# Patient Record
Sex: Female | Born: 1945 | Race: Black or African American | Hispanic: No | Smoking: Former smoker
Health system: Southern US, Community
[De-identification: ages and names within clinical notes are randomized; demographics above are authoritative.]

## PROBLEM LIST (undated history)

## (undated) DIAGNOSIS — E785 Hyperlipidemia, unspecified: Secondary | ICD-10-CM

## (undated) DIAGNOSIS — Z889 Allergy status to unspecified drugs, medicaments and biological substances status: Secondary | ICD-10-CM

## (undated) DIAGNOSIS — M1990 Unspecified osteoarthritis, unspecified site: Secondary | ICD-10-CM

## (undated) DIAGNOSIS — I10 Essential (primary) hypertension: Secondary | ICD-10-CM

## (undated) HISTORY — DX: Hyperlipidemia, unspecified: E78.5

## (undated) HISTORY — PX: TUBAL LIGATION: SHX77

## (undated) HISTORY — PX: BREAST SURGERY: SHX581

## (undated) HISTORY — DX: Essential (primary) hypertension: I10

## (undated) HISTORY — DX: Unspecified osteoarthritis, unspecified site: M19.90

---

## 1998-12-13 ENCOUNTER — Emergency Department (HOSPITAL_COMMUNITY): Admission: EM | Admit: 1998-12-13 | Discharge: 1998-12-13 | Payer: Self-pay | Admitting: Emergency Medicine

## 2005-11-10 ENCOUNTER — Other Ambulatory Visit: Admission: RE | Admit: 2005-11-10 | Discharge: 2005-11-10 | Payer: Self-pay | Admitting: Family Medicine

## 2006-05-09 ENCOUNTER — Encounter: Admission: RE | Admit: 2006-05-09 | Discharge: 2006-05-09 | Payer: Self-pay | Admitting: Family Medicine

## 2006-05-15 ENCOUNTER — Emergency Department (HOSPITAL_COMMUNITY): Admission: EM | Admit: 2006-05-15 | Discharge: 2006-05-15 | Payer: Self-pay | Admitting: Emergency Medicine

## 2007-09-17 ENCOUNTER — Ambulatory Visit: Payer: Self-pay | Admitting: *Deleted

## 2007-09-24 ENCOUNTER — Ambulatory Visit: Payer: Self-pay | Admitting: Internal Medicine

## 2007-09-24 LAB — CONVERTED CEMR LAB
ALT: 21 units/L (ref 0–35)
Albumin: 4.1 g/dL (ref 3.5–5.2)
Basophils Absolute: 0 10*3/uL (ref 0.0–0.1)
CO2: 32 meq/L (ref 19–32)
Calcium: 11.2 mg/dL — ABNORMAL HIGH (ref 8.4–10.5)
Chloride: 101 meq/L (ref 96–112)
Cholesterol: 163 mg/dL (ref 0–200)
Creatinine, Ser: 0.7 mg/dL (ref 0.40–1.20)
Hemoglobin: 15.2 g/dL — ABNORMAL HIGH (ref 12.0–15.0)
Lymphocytes Relative: 44 % (ref 12–46)
Monocytes Absolute: 0.4 10*3/uL (ref 0.1–1.0)
Monocytes Relative: 8 % (ref 3–12)
Neutro Abs: 2.4 10*3/uL (ref 1.7–7.7)
RBC: 5.15 M/uL — ABNORMAL HIGH (ref 3.87–5.11)
RDW: 13.2 % (ref 11.5–15.5)
Total CHOL/HDL Ratio: 4.1

## 2007-10-12 ENCOUNTER — Ambulatory Visit (HOSPITAL_COMMUNITY): Admission: RE | Admit: 2007-10-12 | Discharge: 2007-10-12 | Payer: Self-pay | Admitting: Cardiovascular Disease

## 2007-12-25 ENCOUNTER — Emergency Department (HOSPITAL_COMMUNITY): Admission: EM | Admit: 2007-12-25 | Discharge: 2007-12-25 | Payer: Self-pay | Admitting: Emergency Medicine

## 2008-09-23 ENCOUNTER — Emergency Department (HOSPITAL_COMMUNITY): Admission: EM | Admit: 2008-09-23 | Discharge: 2008-09-24 | Payer: Self-pay | Admitting: Emergency Medicine

## 2008-09-24 ENCOUNTER — Observation Stay (HOSPITAL_COMMUNITY): Admission: EM | Admit: 2008-09-24 | Discharge: 2008-09-26 | Payer: Self-pay | Admitting: Emergency Medicine

## 2008-10-13 ENCOUNTER — Ambulatory Visit (HOSPITAL_COMMUNITY): Admission: RE | Admit: 2008-10-13 | Discharge: 2008-10-13 | Payer: Self-pay | Admitting: Cardiology

## 2008-12-07 ENCOUNTER — Emergency Department (HOSPITAL_COMMUNITY): Admission: EM | Admit: 2008-12-07 | Discharge: 2008-12-08 | Payer: Self-pay | Admitting: Emergency Medicine

## 2009-09-19 LAB — CONVERTED CEMR LAB: Pap Smear: NORMAL

## 2009-10-08 ENCOUNTER — Ambulatory Visit (HOSPITAL_COMMUNITY): Admission: RE | Admit: 2009-10-08 | Discharge: 2009-10-08 | Payer: Self-pay | Admitting: *Deleted

## 2009-10-20 LAB — HM MAMMOGRAPHY: HM Mammogram: NORMAL

## 2010-01-19 ENCOUNTER — Other Ambulatory Visit: Admission: RE | Admit: 2010-01-19 | Discharge: 2010-01-19 | Payer: Self-pay | Admitting: *Deleted

## 2010-08-19 ENCOUNTER — Ambulatory Visit (HOSPITAL_COMMUNITY)
Admission: RE | Admit: 2010-08-19 | Discharge: 2010-08-19 | Payer: Self-pay | Source: Home / Self Care | Admitting: General Surgery

## 2010-10-07 ENCOUNTER — Other Ambulatory Visit (HOSPITAL_COMMUNITY): Payer: Self-pay | Admitting: Internal Medicine

## 2010-10-07 DIAGNOSIS — Z0000 Encounter for general adult medical examination without abnormal findings: Secondary | ICD-10-CM

## 2010-10-07 DIAGNOSIS — Z1231 Encounter for screening mammogram for malignant neoplasm of breast: Secondary | ICD-10-CM

## 2010-10-13 ENCOUNTER — Other Ambulatory Visit: Payer: Self-pay | Admitting: Internal Medicine

## 2010-10-13 ENCOUNTER — Ambulatory Visit
Admission: RE | Admit: 2010-10-13 | Discharge: 2010-10-13 | Payer: Self-pay | Source: Home / Self Care | Attending: Internal Medicine | Admitting: Internal Medicine

## 2010-10-13 DIAGNOSIS — I10 Essential (primary) hypertension: Secondary | ICD-10-CM | POA: Insufficient documentation

## 2010-10-13 DIAGNOSIS — M1990 Unspecified osteoarthritis, unspecified site: Secondary | ICD-10-CM | POA: Insufficient documentation

## 2010-10-13 DIAGNOSIS — E119 Type 2 diabetes mellitus without complications: Secondary | ICD-10-CM | POA: Insufficient documentation

## 2010-10-13 DIAGNOSIS — E785 Hyperlipidemia, unspecified: Secondary | ICD-10-CM | POA: Insufficient documentation

## 2010-10-13 DIAGNOSIS — E8881 Metabolic syndrome: Secondary | ICD-10-CM | POA: Insufficient documentation

## 2010-10-13 LAB — BASIC METABOLIC PANEL
BUN: 13 mg/dL (ref 6–23)
Creatinine, Ser: 1.1 mg/dL (ref 0.4–1.2)
GFR: 67.49 mL/min (ref >60.00)

## 2010-10-13 LAB — TSH: TSH: 2.35 u[IU]/mL (ref 0.35–5.50)

## 2010-10-13 LAB — HEMOGLOBIN A1C: Hgb A1c MFr Bld: 6.6 % — ABNORMAL HIGH (ref 4.6–6.5)

## 2010-10-13 LAB — CONVERTED CEMR LAB
Cholesterol, target level: 200 mg/dL
HDL goal, serum: 40 mg/dL
LDL Goal: 100 mg/dL

## 2010-10-13 LAB — HEPATIC FUNCTION PANEL: Total Bilirubin: 0.5 mg/dL (ref 0.3–1.2)

## 2010-10-14 ENCOUNTER — Encounter: Payer: Self-pay | Admitting: Internal Medicine

## 2010-10-18 ENCOUNTER — Ambulatory Visit: Admit: 2010-10-18 | Payer: Self-pay | Admitting: Internal Medicine

## 2010-10-21 NOTE — Assessment & Plan Note (Signed)
Summary: new medicare pt   pkg/off  #---stc   Vital Signs:  Patient profile:   65 year old female Height:      61 inches Weight:      173.25 pounds BMI:     32.85 O2 Sat:      98 % on Room air Temp:     98.7 degrees F oral Pulse rate:   45 / minute Resp:     16 per minute BP supine:   190 / 110  (right arm) BP sitting:   186 / 106  (left arm) Cuff size:   regular  Vitals Entered By: Rock Nephew CMA (October 13, 2010 3:16 PM)  Nutrition Counseling: Patient's BMI is greater than 25 and therefore counseled on weight management options.  O2 Flow:  Room air CC: New to establish, Preventive Care, Lipid Management, Hypertension Management  Does patient need assistance? Functional Status Self care Ambulation Normal   Primary Care Provider:  Etta Grandchild MD  CC:  New to establish, Preventive Care, Lipid Management, and Hypertension Management.  History of Present Illness: New to me she needs a new PCP. She has been having trouble with her BP lately. She tells me that about 2 months ago she was on Spironolactone, HCTZ, and Lisinopril and her sodium and potassium levels went real low so she was changed to Lasix, Carvedilol, and Clonidine and her BP has remained very high so she wants to get a fresh opinion. Also, she thinks her last A1C was in the 5 range and she wants to see if she can stop taking diabetic meds. Her BS was 167 this morning. There are no old records available today.  Hypertension History:      She denies headache, chest pain, palpitations, dyspnea with exertion, orthopnea, PND, peripheral edema, visual symptoms, neurologic problems, syncope, and side effects from treatment.  She notes no problems with any antihypertensive medication side effects.        Positive major cardiovascular risk factors include female age 2 years old or older, diabetes, hyperlipidemia, and hypertension.  Negative major cardiovascular risk factors include negative family history for ischemic  heart disease and non-tobacco-user status.        Further assessment for target organ damage reveals no history of ASHD, stroke/TIA, or peripheral vascular disease.    Lipid Management History:      Positive NCEP/ATP III risk factors include female age 25 years old or older, diabetes, and hypertension.  Negative NCEP/ATP III risk factors include no family history for ischemic heart disease, non-tobacco-user status, no ASHD (atherosclerotic heart disease), no prior stroke/TIA, no peripheral vascular disease, and no history of aortic aneurysm.        The patient states that she knows about the "Therapeutic Lifestyle Change" diet.  Her compliance with the TLC diet is fair.  The patient expresses understanding of adjunctive measures for cholesterol lowering.  Adjunctive measures started by the patient include fiber, ASA, limit alcohol consumpton, and weight reduction.  She expresses no side effects from her lipid-lowering medication.  The patient denies any symptoms to suggest myopathy or liver disease.     Preventive Screening-Counseling & Management  Alcohol-Tobacco     Alcohol drinks/day: 0     Alcohol Counseling: not indicated; patient does not drink     Smoking Status: quit     Tobacco Counseling: to remain off tobacco products  Caffeine-Diet-Exercise     Does Patient Exercise: no  Hep-HIV-STD-Contraception     Hepatitis Risk: no  risk noted     HIV Risk: no risk noted     STD Risk: no risk noted      Drug Use:  no.        Blood Transfusions:  no.    Clinical Review Panels:  Prevention   Last Mammogram:  normal (10/20/2009)   Last Pap Smear:  normal (09/19/2009)   Last Colonoscopy:  normal (09/19/2009)  Immunizations   Last Flu Vaccine:  given (06/19/2010)   Last Pneumovax:  given (08/19/2010)  Lipid Management   Cholesterol:  163 (09/24/2007)   LDL (bad choesterol):  101 (09/24/2007)   HDL (good cholesterol):  40 (09/24/2007)  Diabetes Management   Creatinine:  0.70  (09/24/2007)   Last Foot Exam:  yes (10/13/2010)   Last Flu Vaccine:  given (06/19/2010)   Last Pneumovax:  given (08/19/2010)  CBC   WBC:  5.2 (09/24/2007)   RBC:  5.15 (09/24/2007)   Hgb:  15.2 (09/24/2007)   Hct:  44.7 (09/24/2007)   Platelets:  210 (09/24/2007)   MCV  86.8 (09/24/2007)   MCHC  34.0 (09/24/2007)   RDW  13.2 (09/24/2007)   PMN:  46 (09/24/2007)   Lymphs:  44 (09/24/2007)   Monos:  8 (09/24/2007)   Eosinophils:  2 (09/24/2007)   Basophil:  1 (09/24/2007)  Complete Metabolic Panel   Glucose:  140 (09/24/2007)   Sodium:  143 (09/24/2007)   Potassium:  3.1 (09/24/2007)   Chloride:  101 (09/24/2007)   CO2:  32 (09/24/2007)   BUN:  13 (09/24/2007)   Creatinine:  0.70 (09/24/2007)   Albumin:  4.1 (09/24/2007)   Total Protein:  7.2 (09/24/2007)   Calcium:  11.2 (09/24/2007)   Total Bili:  0.4 (09/24/2007)   Alk Phos:  50 (09/24/2007)   SGPT (ALT):  21 (09/24/2007)   SGOT (AST):  15 (09/24/2007)   Medications Prior to Update: 1)  None  Current Medications (verified): 1)  Carvedilol 12.5 Mg Tabs (Carvedilol) .... 2 Tabs Two Times A Day 2)  Crestor 20 Mg Tabs (Rosuvastatin Calcium) .... Take 1 Tab By Mouth At Bedtime 3)  Glipizide 10 Mg Xr24h-Tab (Glipizide) .... 1/2 By Mouth Once Daily 4)  Tribenzor 40-5-12.5 Mg Tabs (Olmesartan-Amlodipine-Hctz) .... One By Mouth Once Daily For High Blood Pressure  Allergies (verified): 1)  ! Metformin Hcl 2)  ! Actos (Pioglitazone Hcl) 3)  ! Lisinopril  Past History:  Past Medical History: Diabetes mellitus, type II Hyperlipidemia Hypertension Osteoarthritis  Past Surgical History: Tubal ligation  Family History: Family History Hypertension  Social History: Retired Conservation officer, [place] Alcohol use-no Drug use-no Regular exercise-no Smoking Status:  quit Hepatitis Risk:  no risk noted HIV Risk:  no risk noted STD Risk:  no risk noted Blood Transfusions:  no Drug Use:  no Does Patient Exercise:   no  Review of Systems  The patient denies anorexia, fever, weight loss, weight gain, chest pain, syncope, dyspnea on exertion, peripheral edema, prolonged cough, headaches, hemoptysis, abdominal pain, hematuria, transient blindness, difficulty walking, and depression.   Endo:  Denies cold intolerance, excessive hunger, excessive thirst, excessive urination, heat intolerance, polyuria, and weight change.  Physical Exam  General:  alert, well-developed, well-nourished, well-hydrated, appropriate dress, normal appearance, healthy-appearing, cooperative to examination, good hygiene, and overweight-appearing.   Head:  normocephalic, atraumatic, no abnormalities observed, and no abnormalities palpated.   Eyes:  vision grossly intact, pupils equal, and a-v nicking.   Ears:  External ear exam shows no significant lesions or deformities.  Otoscopic examination reveals  clear canals, tympanic membranes are intact bilaterally without bulging, retraction, inflammation or discharge. Hearing is grossly normal bilaterally. Mouth:  Oral mucosa and oropharynx without lesions or exudates.  Teeth in good repair. Neck:  supple, full ROM, no masses, no thyromegaly, no thyroid nodules or tenderness, no JVD, normal carotid upstroke, no carotid bruits, no cervical lymphadenopathy, and no neck tenderness.   Lungs:  normal respiratory effort, no intercostal retractions, no accessory muscle use, normal breath sounds, no dullness, no fremitus, no crackles, and no wheezes.   Heart:  normal rate, regular rhythm, no murmur, no gallop, no rub, and no JVD.   Abdomen:  soft, non-tender, normal bowel sounds, no distention, no masses, no guarding, no rigidity, no rebound tenderness, no abdominal hernia, no inguinal hernia, no hepatomegaly, and no splenomegaly.   Msk:  No deformity or scoliosis noted of thoracic or lumbar spine.   Pulses:  R and L carotid,radial,femoral,dorsalis pedis and posterior tibial pulses are full and equal  bilaterally Extremities:  No clubbing, cyanosis, edema, or deformity noted with normal full range of motion of all joints.   Neurologic:  No cranial nerve deficits noted. Station and gait are normal. Plantar reflexes are down-going bilaterally. DTRs are symmetrical throughout. Sensory, motor and coordinative functions appear intact.  Diabetes Management Exam:    Foot Exam (with socks and/or shoes not present):       Sensory-Pinprick/Light touch:          Left medial foot (L-4): normal          Left dorsal foot (L-5): normal          Left lateral foot (S-1): normal          Right medial foot (L-4): normal          Right dorsal foot (L-5): normal          Right lateral foot (S-1): normal       Sensory-Monofilament:          Left foot: normal          Right foot: normal       Inspection:          Left foot: normal          Right foot: normal       Nails:          Left foot: normal          Right foot: normal   Impression & Recommendations:  Problem # 1:  HYPERTENSION (ICD-401.9) Assessment Deteriorated  The following medications were removed from the medication list:    Clonidine Hcl 0.1 Mg Tabs (Clonidine hcl) .Marland Kitchen... 2 tabs two times a day    Furosemide 40 Mg Tabs (Furosemide) .Marland Kitchen... 1 tab  every morning  and 1/2 at bedtime Her updated medication list for this problem includes:    Carvedilol 12.5 Mg Tabs (Carvedilol) .Marland Kitchen... 2 tabs two times a day    Tribenzor 40-5-12.5 Mg Tabs (Olmesartan-amlodipine-hctz) ..... One by mouth once daily for high blood pressure  Orders: Venipuncture (04540) TLB-BMP (Basic Metabolic Panel-BMET) (80048-METABOL) TLB-Hepatic/Liver Function Pnl (80076-HEPATIC) TLB-TSH (Thyroid Stimulating Hormone) (84443-TSH) TLB-A1C / Hgb A1C (Glycohemoglobin) (83036-A1C)  BP today: 186/106  Labs Reviewed: K+: 3.1 (09/24/2007) Creat: : 0.70 (09/24/2007)   Chol: 163 (09/24/2007)   HDL: 40 (09/24/2007)   LDL: 101 (09/24/2007)   TG: 108 (09/24/2007)  Problem # 2:   DIABETES MELLITUS, TYPE II (ICD-250.00) Assessment: Unchanged  Her updated medication list for this problem includes:  Glipizide 10 Mg Xr24h-tab (Glipizide) .Marland Kitchen... 1/2 by mouth once daily    Tribenzor 40-5-12.5 Mg Tabs (Olmesartan-amlodipine-hctz) ..... One by mouth once daily for high blood pressure  Orders: Venipuncture (95621) TLB-BMP (Basic Metabolic Panel-BMET) (80048-METABOL) TLB-Hepatic/Liver Function Pnl (80076-HEPATIC) TLB-TSH (Thyroid Stimulating Hormone) (84443-TSH) TLB-A1C / Hgb A1C (Glycohemoglobin) (30865-H8I) Ophthalmology Referral (Ophthalmology)  Labs Reviewed: Creat: 0.70 (09/24/2007)     Problem # 3:  HYPERLIPIDEMIA (ICD-272.4) Assessment: Unchanged  Her updated medication list for this problem includes:    Crestor 20 Mg Tabs (Rosuvastatin calcium) .Marland Kitchen... Take 1 tab by mouth at bedtime  Orders: Venipuncture (69629) TLB-BMP (Basic Metabolic Panel-BMET) (80048-METABOL) TLB-Hepatic/Liver Function Pnl (80076-HEPATIC) TLB-TSH (Thyroid Stimulating Hormone) (84443-TSH) TLB-A1C / Hgb A1C (Glycohemoglobin) (83036-A1C)  Labs Reviewed: SGOT: 15 (09/24/2007)   SGPT: 21 (09/24/2007)   HDL:40 (09/24/2007)  LDL:101 (09/24/2007)  Chol:163 (09/24/2007)  Trig:108 (09/24/2007)  Complete Medication List: 1)  Carvedilol 12.5 Mg Tabs (Carvedilol) .... 2 tabs two times a day 2)  Crestor 20 Mg Tabs (Rosuvastatin calcium) .... Take 1 tab by mouth at bedtime 3)  Glipizide 10 Mg Xr24h-tab (Glipizide) .... 1/2 by mouth once daily 4)  Tribenzor 40-5-12.5 Mg Tabs (Olmesartan-amlodipine-hctz) .... One by mouth once daily for high blood pressure  Hypertension Assessment/Plan:      The patient's hypertensive risk group is category C: Target organ damage and/or diabetes.  Her calculated 10 year risk of coronary heart disease is > 32 %.  Today's blood pressure is 186/106.  Her blood pressure goal is < 130/80.  Lipid Assessment/Plan:      Based on NCEP/ATP III, the patient's risk factor  category is "history of diabetes".  The patient's lipid goals are as follows: Total cholesterol goal is 200; LDL cholesterol goal is 100; HDL cholesterol goal is 40; Triglyceride goal is 150.    PAP Screening:    Hx Cervical Dysplasia in last 5 yrs? No    3 normal PAP smears in last 5 yrs? Yes    Last PAP smear:  09/19/2009    Reviewed PAP smear recommendations:  patient defers to GYN provider  Mammogram Screening:    Last Mammogram:  10/20/2009  Osteoporosis Risk Assessment:  Risk Factors for Fracture or Low Bone Density:   Smoking status:       quit  Immunization & Chemoprophylaxis:    Influenza vaccine: given  (06/19/2010)    Pneumovax: given  (08/19/2010)   Patient Instructions: 1)  Please schedule a follow-up appointment in 1 month. 2)  It is important that you exercise regularly at least 20 minutes 5 times a week. If you develop chest pain, have severe difficulty breathing, or feel very tired , stop exercising immediately and seek medical attention. 3)  You need to lose weight. Consider a lower calorie diet and regular exercise.  4)  Check your blood sugars regularly. If your readings are usually above 200 or below 70 you should contact our office. 5)  It is important that your Diabetic A1c level is checked every 3 months. 6)  See your eye doctor yearly to check for diabetic eye damage. 7)  Check your feet each night for sore areas, calluses or signs of infection. 8)  Check your Blood Pressure regularly. If it is above 130/80: you should make an appointment. Prescriptions: TRIBENZOR 40-5-12.5 MG TABS (OLMESARTAN-AMLODIPINE-HCTZ) One by mouth once daily for high blood pressure  #140 x 0   Entered and Authorized by:   Etta Grandchild MD   Signed by:   Maisie Fus  Karsten Ro MD on 10/13/2010   Method used:   Samples Given   RxID:   [id number]    Orders Added: 1)  Venipuncture [36415] 2)  TLB-BMP (Basic Metabolic Panel-BMET) [80048-METABOL] 3)  TLB-Hepatic/Liver Function  Pnl [80076-HEPATIC] 4)  TLB-TSH (Thyroid Stimulating Hormone) [84443-TSH] 5)  TLB-A1C / Hgb A1C (Glycohemoglobin) [83036-A1C] 6)  Ophthalmology Referral [Ophthalmology] 7)  New Patient Level IV [56213]    Preventive Care Screening  Last Pneumovax:    Date:  08/19/2010    Results:  given   Last Flu Shot:    Date:  06/19/2010    Results:  given   Mammogram:    Date:  10/20/2009    Results:  normal   Colonoscopy:    Date:  09/19/2009    Results:  normal   Pap Smear:    Date:  09/19/2009    Results:  normal

## 2010-10-21 NOTE — Letter (Signed)
Summary: Results Follow-up Letter  [facility]  [address] [place], [place] [address]   Phone: [phone]  Fax: [phone]    10/14/2010  [address] [place], [place]  [address]  Dear Ms. Lehane,   The following are the results of your recent test(s):  Test     Result     Blood sugar=56   slightly low Calcium level   slightly high Liver/kidney   normal A1C=6.6     good control Thyroid     normal   _________________________________________________________  Please call for an appointment as directed, in the meantime you can stop the current medicine you take for diabetes and we can re-evaluate in 1-2 months _________________________________________________________ _________________________________________________________ _________________________________________________________  Sincerely,  Sanda Linger MD [facility]

## 2010-10-27 ENCOUNTER — Other Ambulatory Visit: Payer: MEDICARE

## 2010-10-27 ENCOUNTER — Encounter (INDEPENDENT_AMBULATORY_CARE_PROVIDER_SITE_OTHER): Payer: Self-pay | Admitting: *Deleted

## 2010-10-27 ENCOUNTER — Other Ambulatory Visit: Payer: Self-pay | Admitting: Internal Medicine

## 2010-10-27 ENCOUNTER — Encounter: Payer: Self-pay | Admitting: Internal Medicine

## 2010-10-27 ENCOUNTER — Ambulatory Visit (INDEPENDENT_AMBULATORY_CARE_PROVIDER_SITE_OTHER): Payer: MEDICARE | Admitting: Internal Medicine

## 2010-10-27 DIAGNOSIS — E119 Type 2 diabetes mellitus without complications: Secondary | ICD-10-CM

## 2010-10-27 DIAGNOSIS — I10 Essential (primary) hypertension: Secondary | ICD-10-CM

## 2010-10-27 DIAGNOSIS — E785 Hyperlipidemia, unspecified: Secondary | ICD-10-CM

## 2010-10-27 LAB — CONVERTED CEMR LAB
Calcium, Total (PTH): 10.2 mg/dL (ref 8.4–10.5)
PTH: 52.2 pg/mL (ref 14.0–72.0)

## 2010-10-27 LAB — BASIC METABOLIC PANEL
CO2: 30 mEq/L (ref 19–32)
Calcium: 10.2 mg/dL (ref 8.4–10.5)
GFR: 106 mL/min (ref >60.00)
Sodium: 141 mEq/L (ref 135–145)

## 2010-10-27 LAB — PHOSPHORUS: Phosphorus: 2.5 mg/dL (ref 2.3–4.6)

## 2010-11-04 NOTE — Assessment & Plan Note (Signed)
Summary: f/u blood pressure/#/cd   Vital Signs:  Patient profile:   65 year old female Menstrual status:  postmenopausal Height:      61 inches Weight:      175 pounds O2 Sat:      98 % on Room air Temp:     98.7 degrees F oral Pulse rate:   60 / minute Pulse rhythm:   regular Resp:     16 per minute BP sitting:   134 / 82  (left arm)  O2 Flow:  Room air  Primary Care Provider:  Etta Grandchild MD   History of Present Illness:  Follow-Up Visit      This is a 65 year old woman who presents for Follow-up visit.  The patient denies chest pain, palpitations, dizziness, syncope, low blood sugar symptoms, high blood sugar symptoms, edema, SOB, DOE, PND, and orthopnea.  Since the last visit the patient notes no new problems or concerns.  The patient reports taking meds as prescribed, monitoring BP, monitoring blood sugars, and dietary compliance.  When questioned about possible medication side effects, the patient notes none.    Preventive Screening-Counseling & Management  Alcohol-Tobacco     Alcohol drinks/day: 0     Alcohol Counseling: not indicated; patient does not drink     Smoking Status: quit     Tobacco Counseling: to remain off tobacco products  Hep-HIV-STD-Contraception     Hepatitis Risk: no risk noted     HIV Risk: no risk noted     STD Risk: no risk noted      Drug Use:  no.        Blood Transfusions:  no.    Clinical Review Panels:  Prevention   Last Mammogram:  normal (10/20/2009)   Last Pap Smear:  normal (09/19/2009)   Last Colonoscopy:  normal (09/19/2009)  Immunizations   Last Flu Vaccine:  given (06/19/2010)   Last Pneumovax:  given (08/19/2010)  Lipid Management   Cholesterol:  163 (09/24/2007)   LDL (bad choesterol):  101 (09/24/2007)   HDL (good cholesterol):  40 (09/24/2007)  Diabetes Management   HgBA1C:  6.6 (10/13/2010)   Creatinine:  1.1 (10/13/2010)   Last Foot Exam:  yes (10/13/2010)   Last Flu Vaccine:  given (06/19/2010)   Last  Pneumovax:  given (08/19/2010)  CBC   WBC:  5.2 (09/24/2007)   RBC:  5.15 (09/24/2007)   Hgb:  15.2 (09/24/2007)   Hct:  44.7 (09/24/2007)   Platelets:  210 (09/24/2007)   MCV  86.8 (09/24/2007)   MCHC  34.0 (09/24/2007)   RDW  13.2 (09/24/2007)   PMN:  46 (09/24/2007)   Lymphs:  44 (09/24/2007)   Monos:  8 (09/24/2007)   Eosinophils:  2 (09/24/2007)   Basophil:  1 (09/24/2007)  Complete Metabolic Panel   Glucose:  56 (10/13/2010)   Sodium:  143 (10/13/2010)   Potassium:  3.9 (10/13/2010)   Chloride:  103 (10/13/2010)   CO2:  35 (10/13/2010)   BUN:  13 (10/13/2010)   Creatinine:  1.1 (10/13/2010)   Albumin:  3.9 (10/13/2010)   Total Protein:  6.9 (10/13/2010)   Calcium:  10.7 (10/13/2010)   Total Bili:  0.5 (10/13/2010)   Alk Phos:  51 (10/13/2010)   SGPT (ALT):  31 (10/13/2010)   SGOT (AST):  23 (10/13/2010)   Medications Prior to Update: 1)  Carvedilol 12.5 Mg Tabs (Carvedilol) .... 2 Tabs Two Times A Day 2)  Crestor 20 Mg Tabs (Rosuvastatin Calcium) .Marland KitchenMarland KitchenMarland Kitchen  Take 1 Tab By Mouth At Bedtime 3)  Glipizide 10 Mg Xr24h-Tab (Glipizide) .... 1/2 By Mouth Once Daily 4)  Tribenzor 40-5-12.5 Mg Tabs (Olmesartan-Amlodipine-Hctz) .... One By Mouth Once Daily For High Blood Pressure  Current Medications (verified): 1)  Carvedilol 12.5 Mg Tabs (Carvedilol) .... 2 Tabs Two Times A Day 2)  Crestor 20 Mg Tabs (Rosuvastatin Calcium) .... Take 1 Tab By Mouth At Bedtime 3)  Tribenzor 40-5-12.5 Mg Tabs (Olmesartan-Amlodipine-Hctz) .... One By Mouth Once Daily For High Blood Pressure 4)  Bayer Contour Monitor W/device Kit (Blood Glucose Monitoring Suppl) .... Use Two Times A Day As Directed 5)  Bayer Contour Test  Strp (Glucose Blood) .... Use Two Times A Day As Directed  Allergies (verified): 1)  ! Metformin Hcl 2)  ! Actos (Pioglitazone Hcl) 3)  ! Lisinopril  Past History:  Past Medical History: Last updated: 10/13/2010 Diabetes mellitus, type  II Hyperlipidemia Hypertension Osteoarthritis  Past Surgical History: Last updated: 10/13/2010 Tubal ligation  Family History: Last updated: 10/13/2010 Family History Hypertension  Social History: Last updated: 10/13/2010 Retired Widow/Widower Alcohol use-no Drug use-no Regular exercise-no  Risk Factors: Alcohol Use: 0 (10/27/2010) Exercise: no (10/13/2010)  Risk Factors: Smoking Status: quit (10/27/2010)  Family History: Reviewed history from 10/13/2010 and no changes required. Family History Hypertension  Social History: Reviewed history from 10/13/2010 and no changes required. Retired Widow/Widower Alcohol use-no Drug use-no Regular exercise-no  Review of Systems  The patient denies anorexia, fever, weight loss, weight gain, chest pain, syncope, dyspnea on exertion, peripheral edema, prolonged cough, headaches, hemoptysis, abdominal pain, hematuria, muscle weakness, difficulty walking, depression, abnormal bleeding, enlarged lymph nodes, and angioedema.   Endo:  Denies cold intolerance, excessive hunger, excessive thirst, excessive urination, heat intolerance, polyuria, and weight change.  Physical Exam  General:  alert, well-developed, well-nourished, well-hydrated, appropriate dress, normal appearance, healthy-appearing, cooperative to examination, good hygiene, and overweight-appearing.   Head:  normocephalic, atraumatic, no abnormalities observed, and no abnormalities palpated.   Mouth:  Oral mucosa and oropharynx without lesions or exudates.  Teeth in good repair. Neck:  supple, full ROM, no masses, no thyromegaly, no thyroid nodules or tenderness, no JVD, normal carotid upstroke, no carotid bruits, no cervical lymphadenopathy, and no neck tenderness.   Lungs:  normal respiratory effort, no intercostal retractions, no accessory muscle use, normal breath sounds, no dullness, no fremitus, no crackles, and no wheezes.   Heart:  normal rate, regular rhythm, no  murmur, no gallop, no rub, and no JVD.   Abdomen:  soft, non-tender, normal bowel sounds, no distention, no masses, no guarding, no rigidity, no rebound tenderness, no abdominal hernia, no inguinal hernia, no hepatomegaly, and no splenomegaly.   Msk:  No deformity or scoliosis noted of thoracic or lumbar spine.   Pulses:  R and L carotid,radial,femoral,dorsalis pedis and posterior tibial pulses are full and equal bilaterally Extremities:  No clubbing, cyanosis, edema, or deformity noted with normal full range of motion of all joints.   Neurologic:  No cranial nerve deficits noted. Station and gait are normal. Plantar reflexes are down-going bilaterally. DTRs are symmetrical throughout. Sensory, motor and coordinative functions appear intact. Skin:  turgor normal, color normal, no rashes, no suspicious lesions, no ecchymoses, no petechiae, no purpura, no ulcerations, and no edema.   Cervical Nodes:  no anterior cervical adenopathy and no posterior cervical adenopathy.   Psych:  Oriented X3, memory intact for recent and remote, normally interactive, good eye contact, not anxious appearing, not depressed appearing, not agitated, and  not suicidal.     Impression & Recommendations:  Problem # 1:  HYPERCALCEMIA (ICD-275.42) Assessment New  Orders: Venipuncture (95284) T-Parathyroid Hormone, Intact w/ Calcium (13244-01027) TLB-BMP (Basic Metabolic Panel-BMET) (80048-METABOL) TLB-Phosphorus (84100-PHOS)  Problem # 2:  HYPERTENSION (ICD-401.9) Assessment: Improved  Her updated medication list for this problem includes:    Carvedilol 12.5 Mg Tabs (Carvedilol) .Marland Kitchen... 2 tabs two times a day    Tribenzor 40-5-12.5 Mg Tabs (Olmesartan-amlodipine-hctz) ..... One by mouth once daily for high blood pressure  BP today: 134/82 Prior BP: 190/110 (10/13/2010)  Prior 10 Yr Risk Heart Disease: > 32 % (10/13/2010)  Labs Reviewed: K+: 3.9 (10/13/2010) Creat: : 1.1 (10/13/2010)   Chol: 163 (09/24/2007)    HDL: 40 (09/24/2007)   LDL: 101 (09/24/2007)   TG: 108 (09/24/2007)  Problem # 3:  DIABETES MELLITUS, TYPE II (ICD-250.00) Assessment: Improved  The following medications were removed from the medication list:    Glipizide 10 Mg Xr24h-tab (Glipizide) .Marland Kitchen... 1/2 by mouth once daily Her updated medication list for this problem includes:    Tribenzor 40-5-12.5 Mg Tabs (Olmesartan-amlodipine-hctz) ..... One by mouth once daily for high blood pressure  Labs Reviewed: Creat: 1.1 (10/13/2010)    Reviewed HgBA1c results: 6.6 (10/13/2010)  Complete Medication List: 1)  Carvedilol 12.5 Mg Tabs (Carvedilol) .... 2 tabs two times a day 2)  Crestor 20 Mg Tabs (Rosuvastatin calcium) .... Take 1 tab by mouth at bedtime 3)  Tribenzor 40-5-12.5 Mg Tabs (Olmesartan-amlodipine-hctz) .... One by mouth once daily for high blood pressure 4)  Bayer Contour Monitor W/device Kit (Blood glucose monitoring suppl) .... Use two times a day as directed 5)  Bayer Contour Test Strp (Glucose blood) .... Use two times a day as directed  Patient Instructions: 1)  Please schedule a follow-up appointment in 3 months. 2)  It is important that you exercise regularly at least 20 minutes 5 times a week. If you develop chest pain, have severe difficulty breathing, or feel very tired , stop exercising immediately and seek medical attention. 3)  You need to lose weight. Consider a lower calorie diet and regular exercise.  4)  Check your blood sugars regularly. If your readings are usually above 200 or below 70 you should contact our office. 5)  It is important that your Diabetic A1c level is checked every 3 months. 6)  See your eye doctor yearly to check for diabetic eye damage. 7)  Check your feet each night for sore areas, calluses or signs of infection. 8)  Check your Blood Pressure regularly. If it is above 130/80: you should make an appointment. Prescriptions: BAYER CONTOUR TEST  STRP (GLUCOSE BLOOD) Use two times a day as  directed  #100 x 0   Entered and Authorized by:   Etta Grandchild MD   Signed by:   Etta Grandchild MD on 10/27/2010   Method used:   Samples Given   RxID:   [id number] BAYER CONTOUR MONITOR W/DEVICE KIT (BLOOD GLUCOSE MONITORING SUPPL) Use two times a day as directed  #1 x 0   Entered and Authorized by:   Etta Grandchild MD   Signed by:   Etta Grandchild MD on 10/27/2010   Method used:   Samples Given   RxID:   [id number]    Orders Added: 1)  Venipuncture [16606] 2)  T-Parathyroid Hormone, Intact w/ Calcium [30160-10932] 3)  TLB-BMP (Basic Metabolic Panel-BMET) [80048-METABOL] 4)  TLB-Phosphorus [84100-PHOS] 5)  Est. Patient Level IV [35573]

## 2010-11-08 ENCOUNTER — Telehealth: Payer: Self-pay | Admitting: Internal Medicine

## 2010-11-15 ENCOUNTER — Ambulatory Visit (HOSPITAL_COMMUNITY)
Admission: RE | Admit: 2010-11-15 | Discharge: 2010-11-15 | Disposition: A | Discharge status: Home / Self Care | Payer: MEDICARE | Source: Ambulatory Visit | Attending: Internal Medicine | Admitting: Internal Medicine

## 2010-11-15 DIAGNOSIS — Z1231 Encounter for screening mammogram for malignant neoplasm of breast: Secondary | ICD-10-CM

## 2010-11-16 NOTE — Progress Notes (Signed)
Summary: Elevated cbg  Phone Note Call from Patient Call back at [facility] Phone [phone]   Summary of Call: Pt left vm - worried that her blood sugar is remaining elevated. Wants to know if MD thinks she needs rx?  Initial call taken by: Lamar Sprinkles, CMA,  November 08, 2010 1:56 PM  Follow-up for Phone Call        according to her last A1C test she does not need a med Follow-up by: Etta Grandchild MD,  November 08, 2010 2:00 PM  Additional Follow-up for Phone Call Additional follow up Details #1::        Spoke w/pt - she is concerned that her cbg's have been too elevated. Fasting cbg's since the 11th [phone] 194 After meals readings - 185, 176, 189 Additional Follow-up by: Lamar Sprinkles, CMA,  November 08, 2010 5:04 PM    Additional Follow-up for Phone Call Additional follow up Details #2::    please send this Rx to her pharmacy Follow-up by: Etta Grandchild MD,  November 09, 2010 7:40 AM  Additional Follow-up for Phone Call Additional follow up Details #3:: Details for Additional Follow-up Action Taken: Pt informed  Additional Follow-up by: Lamar Sprinkles, CMA,  November 09, 2010 10:31 AM  New/Updated Medications: JANUVIA 100 MG TABS (SITAGLIPTIN PHOSPHATE) One by mouth once daily for diabetes Prescriptions: JANUVIA 100 MG TABS (SITAGLIPTIN PHOSPHATE) One by mouth once daily for diabetes  #30 x 11   Entered by:   Lamar Sprinkles, CMA   Authorized by:   Etta Grandchild MD   Signed by:   Lamar Sprinkles, CMA on 11/09/2010   Method used:   Electronically to        [facility] [address] [phone]* (retail)       [address]       [address]       [place], [place]  [address]       Ph: [phone]       Fax: [phone]   RxID:   [id number] JANUVIA 100 MG TABS (SITAGLIPTIN PHOSPHATE) One by mouth once daily for diabetes  #30 x 11   Entered and Authorized by:   Etta Grandchild MD   Signed by:   Etta Grandchild MD on 11/09/2010   Method used:    Historical   RxID:   [id number]

## 2010-11-17 ENCOUNTER — Telehealth: Payer: Self-pay | Admitting: Internal Medicine

## 2010-11-18 ENCOUNTER — Ambulatory Visit (INDEPENDENT_AMBULATORY_CARE_PROVIDER_SITE_OTHER): Payer: MEDICARE | Admitting: Internal Medicine

## 2010-11-18 ENCOUNTER — Encounter: Payer: Self-pay | Admitting: Internal Medicine

## 2010-11-18 DIAGNOSIS — E119 Type 2 diabetes mellitus without complications: Secondary | ICD-10-CM

## 2010-11-18 DIAGNOSIS — I10 Essential (primary) hypertension: Secondary | ICD-10-CM

## 2010-11-25 NOTE — Assessment & Plan Note (Signed)
Summary: HIGH BLOOD SUGAR-WAS TOLD BY THIS OFFICE TO SCHED PER PT--STC   Vital Signs:  Patient profile:   65 year old female Menstrual status:  postmenopausal Height:      61 inches Weight:      177 pounds BMI:     33.56 O2 Sat:      97 % on Room air Temp:     98.8 degrees F oral Pulse rate:   54 / minute Pulse rhythm:   regular Resp:     16 per minute BP sitting:   128 / 80  (left arm) Cuff size:   regular  Vitals Entered By: Rock Nephew CMA (November 18, 2010 10:21 AM)  O2 Flow:  Room air CC: follow up discuss elevated AM CBG Is Patient Diabetic? Yes Did you bring your meter with you today? No Pain Assessment Patient in pain? no       Does patient need assistance? Functional Status Self care Ambulation Normal   Primary Care Provider:  Etta Grandchild MD  CC:  follow up discuss elevated AM CBG.  History of Present Illness:  Follow-Up Visit      This is a 65 year old woman who presents for Follow-up visit.  The patient complains of high blood sugar symptoms, but denies chest pain, palpitations, dizziness, syncope, low blood sugar symptoms, edema, SOB, DOE, PND, and orthopnea.  Since the last visit the patient notes problems with medications.  The patient reports taking meds as prescribed, monitoring BP, monitoring blood sugars, and dietary compliance.  When questioned about possible medication side effects, the patient notes none.    Preventive Screening-Counseling & Management  Alcohol-Tobacco     Alcohol drinks/day: 0     Alcohol Counseling: not indicated; patient does not drink     Smoking Status: quit     Tobacco Counseling: to remain off tobacco products  Hep-HIV-STD-Contraception     Hepatitis Risk: no risk noted     HIV Risk: no risk noted     STD Risk: no risk noted      Drug Use:  no.        Blood Transfusions:  no.    Clinical Review Panels:  Prevention   Last Mammogram:  normal (10/20/2009)   Last Pap Smear:  normal (09/19/2009)   Last  Colonoscopy:  normal (09/19/2009)  Immunizations   Last Flu Vaccine:  given (06/19/2010)   Last Pneumovax:  given (08/19/2010)  Lipid Management   Cholesterol:  163 (09/24/2007)   LDL (bad choesterol):  101 (09/24/2007)   HDL (good cholesterol):  40 (09/24/2007)  Diabetes Management   HgBA1C:  6.6 (10/13/2010)   Creatinine:  0.7 (10/27/2010)   Last Foot Exam:  yes (10/13/2010)   Last Flu Vaccine:  given (06/19/2010)   Last Pneumovax:  given (08/19/2010)  CBC   WBC:  5.2 (09/24/2007)   RBC:  5.15 (09/24/2007)   Hgb:  15.2 (09/24/2007)   Hct:  44.7 (09/24/2007)   Platelets:  210 (09/24/2007)   MCV  86.8 (09/24/2007)   MCHC  34.0 (09/24/2007)   RDW  13.2 (09/24/2007)   PMN:  46 (09/24/2007)   Lymphs:  44 (09/24/2007)   Monos:  8 (09/24/2007)   Eosinophils:  2 (09/24/2007)   Basophil:  1 (09/24/2007)  Complete Metabolic Panel   Glucose:  117 (10/27/2010)   Sodium:  141 (10/27/2010)   Potassium:  3.6 (10/27/2010)   Chloride:  103 (10/27/2010)   CO2:  30 (10/27/2010)   BUN:  16 (10/27/2010)   Creatinine:  0.7 (10/27/2010)   Albumin:  3.9 (10/13/2010)   Total Protein:  6.9 (10/13/2010)   Calcium:  10.2 (10/27/2010)   Total Bili:  0.5 (10/13/2010)   Alk Phos:  51 (10/13/2010)   SGPT (ALT):  31 (10/13/2010)   SGOT (AST):  23 (10/13/2010)   Medications Prior to Update: 1)  Carvedilol 12.5 Mg Tabs (Carvedilol) .... 2 Tabs Two Times A Day 2)  Crestor 20 Mg Tabs (Rosuvastatin Calcium) .... Take 1 Tab By Mouth At Bedtime 3)  Tribenzor 40-5-12.5 Mg Tabs (Olmesartan-Amlodipine-Hctz) .... One By Mouth Once Daily For High Blood Pressure 4)  Bayer Contour Monitor W/device Kit (Blood Glucose Monitoring Suppl) .... Use Two Times A Day As Directed 5)  Bayer Contour Test  Strp (Glucose Blood) .... Use Two Times A Day As Directed 6)  Januvia 100 Mg Tabs (Sitagliptin Phosphate) .... One By Mouth Once Daily For Diabetes  Current Medications (verified): 1)  Carvedilol 12.5 Mg Tabs  (Carvedilol) .... 2 Tabs Two Times A Day 2)  Crestor 20 Mg Tabs (Rosuvastatin Calcium) .... Take 1 Tab By Mouth At Bedtime 3)  Tribenzor 40-5-12.5 Mg Tabs (Olmesartan-Amlodipine-Hctz) .... One By Mouth Once Daily For High Blood Pressure 4)  Bayer Contour Monitor W/device Kit (Blood Glucose Monitoring Suppl) .... Use Two Times A Day As Directed 5)  Bayer Contour Test  Strp (Glucose Blood) .... Use Two Times A Day As Directed 6)  Victoza 18 Mg/39ml Soln (Liraglutide) .... Give 0.6 For 2 Weeks, Then 1.2 For 2 Weeks, Then 1.8 For Two Weeks  Allergies (verified): 1)  ! Metformin Hcl 2)  ! Actos (Pioglitazone Hcl) 3)  ! Lisinopril  Past History:  Past Medical History: Last updated: 10/13/2010 Diabetes mellitus, type II Hyperlipidemia Hypertension Osteoarthritis  Past Surgical History: Last updated: 10/13/2010 Tubal ligation  Family History: Last updated: 10/13/2010 Family History Hypertension  Social History: Last updated: 10/13/2010 Retired Widow/Widower Alcohol use-no Drug use-no Regular exercise-no  Risk Factors: Alcohol Use: 0 (11/18/2010) Exercise: no (10/13/2010)  Risk Factors: Smoking Status: quit (11/18/2010)  Family History: Reviewed history from 10/13/2010 and no changes required. Family History Hypertension  Social History: Reviewed history from 10/13/2010 and no changes required. Retired Widow/Widower Alcohol use-no Drug use-no Regular exercise-no  Review of Systems  The patient denies anorexia, fever, weight loss, weight gain, chest pain, syncope, dyspnea on exertion, peripheral edema, prolonged cough, headaches, hemoptysis, abdominal pain, hematuria, muscle weakness, suspicious skin lesions, transient blindness, difficulty walking, depression, enlarged lymph nodes, and angioedema.   Endo:  Denies cold intolerance, excessive hunger, excessive thirst, excessive urination, heat intolerance, polyuria, and weight change.  Physical Exam  General:   alert, well-developed, well-nourished, well-hydrated, appropriate dress, normal appearance, healthy-appearing, cooperative to examination, good hygiene, and overweight-appearing.   Head:  normocephalic, atraumatic, no abnormalities observed, and no abnormalities palpated.   Eyes:  vision grossly intact, pupils equal, and a-v nicking.   Mouth:  Oral mucosa and oropharynx without lesions or exudates.  Teeth in good repair. Neck:  supple, full ROM, no masses, no thyromegaly, no thyroid nodules or tenderness, no JVD, normal carotid upstroke, no carotid bruits, no cervical lymphadenopathy, and no neck tenderness.   Lungs:  normal respiratory effort, no intercostal retractions, no accessory muscle use, normal breath sounds, no dullness, no fremitus, no crackles, and no wheezes.   Heart:  normal rate, regular rhythm, no murmur, no gallop, no rub, and no JVD.   Abdomen:  soft, non-tender, normal bowel sounds, no distention, no  masses, no guarding, no rigidity, no rebound tenderness, no abdominal hernia, no inguinal hernia, no hepatomegaly, and no splenomegaly.   Msk:  No deformity or scoliosis noted of thoracic or lumbar spine.   Pulses:  R and L carotid,radial,femoral,dorsalis pedis and posterior tibial pulses are full and equal bilaterally Extremities:  No clubbing, cyanosis, edema, or deformity noted with normal full range of motion of all joints.   Neurologic:  No cranial nerve deficits noted. Station and gait are normal. Plantar reflexes are down-going bilaterally. DTRs are symmetrical throughout. Sensory, motor and coordinative functions appear intact. Skin:  turgor normal, color normal, no rashes, no suspicious lesions, no ecchymoses, no petechiae, no purpura, no ulcerations, and no edema.   Cervical Nodes:  no anterior cervical adenopathy and no posterior cervical adenopathy.   Psych:  Oriented X3, memory intact for recent and remote, normally interactive, good eye contact, not anxious appearing, not  depressed appearing, not agitated, and not suicidal.     Impression & Recommendations:  Problem # 1:  DIABETES MELLITUS, TYPE II (ICD-250.00) Assessment Deteriorated  The following medications were removed from the medication list:    Januvia 100 Mg Tabs (Sitagliptin phosphate) ..... One by mouth once daily for diabetes Her updated medication list for this problem includes:    Tribenzor 40-5-12.5 Mg Tabs (Olmesartan-amlodipine-hctz) ..... One by mouth once daily for high blood pressure    Victoza 18 Mg/77ml Soln (Liraglutide) .Marland Kitchen... Give 0.6 for 2 weeks, then 1.2 for 2 weeks, then 1.8 for two weeks  Labs Reviewed: Creat: 0.7 (10/27/2010)    Reviewed HgBA1c results: 6.6 (10/13/2010)  Problem # 2:  HYPERTENSION (ICD-401.9) Assessment: Improved  Her updated medication list for this problem includes:    Carvedilol 12.5 Mg Tabs (Carvedilol) .Marland Kitchen... 2 tabs two times a day    Tribenzor 40-5-12.5 Mg Tabs (Olmesartan-amlodipine-hctz) ..... One by mouth once daily for high blood pressure  BP today: 128/80 Prior BP: 134/82 (10/27/2010)  Prior 10 Yr Risk Heart Disease: > 32 % (10/13/2010)  Labs Reviewed: K+: 3.6 (10/27/2010) Creat: : 0.7 (10/27/2010)   Chol: 163 (09/24/2007)   HDL: 40 (09/24/2007)   LDL: 101 (09/24/2007)   TG: 108 (09/24/2007)  Complete Medication List: 1)  Carvedilol 12.5 Mg Tabs (Carvedilol) .... 2 tabs two times a day 2)  Crestor 20 Mg Tabs (Rosuvastatin calcium) .... Take 1 tab by mouth at bedtime 3)  Tribenzor 40-5-12.5 Mg Tabs (Olmesartan-amlodipine-hctz) .... One by mouth once daily for high blood pressure 4)  Bayer Contour Monitor W/device Kit (Blood glucose monitoring suppl) .... Use two times a day as directed 5)  Bayer Contour Test Strp (Glucose blood) .... Use two times a day as directed 6)  Victoza 18 Mg/43ml Soln (Liraglutide) .... Give 0.6 for 2 weeks, then 1.2 for 2 weeks, then 1.8 for two weeks  Patient Instructions: 1)  Please schedule a follow-up  appointment in 1 month. 2)  It is important that you exercise regularly at least 20 minutes 5 times a week. If you develop chest pain, have severe difficulty breathing, or feel very tired , stop exercising immediately and seek medical attention. 3)  You need to lose weight. Consider a lower calorie diet and regular exercise.  4)  Check your blood sugars regularly. If your readings are usually above 200 or below 70 you should contact our office. 5)  It is important that your Diabetic A1c level is checked every 3 months. 6)  See your eye doctor yearly to check for diabetic eye  damage. 7)  Check your feet each night for sore areas, calluses or signs of infection. 8)  Check your Blood Pressure regularly. If it is above: you should make an appointment. Prescriptions: VICTOZA 18 MG/3ML SOLN (LIRAGLUTIDE) give 0.6 for 2 weeks, then 1.2 for 2 weeks, then 1.8 for two weeks  #2 pens x 0   Entered and Authorized by:   Etta Grandchild MD   Signed by:   Etta Grandchild MD on 11/18/2010   Method used:   Samples Given   RxID:   [id number]    Orders Added: 1)  Est. Patient Level IV [64332]

## 2010-11-25 NOTE — Progress Notes (Signed)
Summary: OV?   Phone Note Call from Patient Call back at [facility] Phone [phone] Call back at [phone]   Summary of Call: Pt left vm stating that [place] has not helped to get cbg's down. Would you like to see pt in office for f/u?  Initial call taken by: Lamar Sprinkles, CMA,  November 17, 2010 9:17 AM  Follow-up for Phone Call        yes Follow-up by: Etta Grandchild MD,  November 17, 2010 9:40 AM  Additional Follow-up for Phone Call Additional follow up Details #1::        left detailed vm on pt's hm # to schedule office visit soon w/MD Additional Follow-up by: Lamar Sprinkles, CMA,  November 17, 2010 10:03 AM

## 2010-11-30 LAB — COMPREHENSIVE METABOLIC PANEL
ALT: 31 U/L (ref 0–35)
CO2: 30 mEq/L (ref 19–32)
Calcium: 10.2 mg/dL (ref 8.4–10.5)
Chloride: 107 mEq/L (ref 96–112)
Creatinine, Ser: 1.09 mg/dL (ref 0.4–1.2)
GFR calc non Af Amer: 50 mL/min — ABNORMAL LOW (ref >60)
Glucose, Bld: 99 mg/dL (ref 70–99)
Total Bilirubin: 0.5 mg/dL (ref 0.3–1.2)

## 2010-11-30 LAB — DIFFERENTIAL
Basophils Relative: 1 % (ref 0–1)
Eosinophils Relative: 3 % (ref 0–5)
Lymphs Abs: 1.6 10*3/uL (ref 0.7–4.0)
Monocytes Absolute: 0.5 10*3/uL (ref 0.1–1.0)
Monocytes Relative: 10 % (ref 3–12)

## 2010-11-30 LAB — CBC
HCT: 38.1 % (ref 36.0–46.0)
Hemoglobin: 13.1 g/dL (ref 12.0–15.0)
MCH: 30.3 pg (ref 26.0–34.0)
MCV: 88.2 fL (ref 78.0–100.0)
RBC: 4.32 MIL/uL (ref 3.87–5.11)

## 2010-12-07 LAB — HM PAP SMEAR: HM Pap smear: NORMAL

## 2010-12-16 ENCOUNTER — Encounter: Payer: Self-pay | Admitting: Internal Medicine

## 2010-12-16 ENCOUNTER — Ambulatory Visit (INDEPENDENT_AMBULATORY_CARE_PROVIDER_SITE_OTHER): Payer: MEDICARE | Admitting: Internal Medicine

## 2010-12-16 DIAGNOSIS — E119 Type 2 diabetes mellitus without complications: Secondary | ICD-10-CM

## 2010-12-16 DIAGNOSIS — I10 Essential (primary) hypertension: Secondary | ICD-10-CM

## 2010-12-16 MED ORDER — LIRAGLUTIDE 18 MG/3ML ~~LOC~~ SOLN: 1.8000 mg | Freq: Every day | SUBCUTANEOUS | Status: DC

## 2010-12-16 NOTE — Assessment & Plan Note (Addendum)
Her BP is well controlled, stay on current regimen. No labs ordered today, will recheck lytes and creat in 3 months

## 2010-12-16 NOTE — Patient Instructions (Signed)
Diabetes, Type 2 Diabetes is a lasting (chronic) disease. In type 2 diabetes, the pancreas does not make enough insulin (a hormone), and the body does not respond normally to the insulin that is made. This type of diabetes was also previously called adult onset diabetes. About 90% of all those who have diabetes have type 2. It usually occurs after the age of 40 but can occur at any age. CAUSES Unlike type 1 diabetes, which happens because insulin is no longer being made, type 2 diabetes happens because the body is making less insulin and has trouble using the insulin properly. SYMPTOMS  Drinking more than usual.   Urinating more than usual.   Blurred vision.   Dry, itchy skin.   Frequent infection like yeast infections in women.   More tired than usual (fatigue).  TREATMENT  Healthy eating.   Exercise.   Medication, if needed.   Monitoring blood glucose (sugar).   Seeing your caregiver regularly.  HOME CARE INSTRUCTIONS  Check your blood glucose (sugar) at least once daily. More frequent monitoring may be necessary, depending on your medications and on how well your diabetes is controlled. Your caregiver will advise you.   Take your medicine as directed by your caregiver.   Do not smoke.   Make wise food choices. Ask your caregiver for information. Weight loss can improve your diabetes.   Learn about low blood glucose (hypoglycemia) and how to treat it.   Get your eyes checked regularly.   Have a yearly physical exam. Have your blood pressure checked. Get your blood and urine tested.   Wear a pendant or bracelet saying that you have diabetes.   Check your feet every night for sores. Let your caregiver know if you have sores that are not healing.  SEEK MEDICAL CARE IF:  You are having problems keeping your blood glucose at target range.   You feel you might be having problems with your medicines.   You have symptoms of an illness that is not improving after 24  hours.   You have a sore or wound that is not healing.   You notice a change in vision or a new problem with your vision.   You develop a fever of more than 100.5.  Document Released: 09/05/2005 Document Re-Released: 09/27/2009 [employer] Patient Information 2011 [employer]. 

## 2010-12-16 NOTE — Assessment & Plan Note (Signed)
She is doing well on Victoza, will increase her dose to 1.8 mg and recheck her A1C in 3 months

## 2010-12-16 NOTE — Progress Notes (Signed)
Subjective:    Patient ID: [name], female    DOB: 1945/10/01, 65 y.o.   MRN: [id number]  Diabetes She presents for her follow-up diabetic visit. She has type 2 diabetes mellitus. No MedicAlert identification noted. Her disease course has been improving. There are no hypoglycemic associated symptoms. Pertinent negatives for hypoglycemia include no confusion, dizziness, headaches, pallor or tremors. Pertinent negatives for diabetes include no blurred vision, no chest pain, no fatigue, no foot paresthesias, no foot ulcerations, no polydipsia, no polyphagia, no polyuria, no visual change, no weakness and no weight loss. There are no hypoglycemic complications. Symptoms are improving. There are no diabetic complications. She is compliant with treatment all of the time. Her weight is stable. She is following a diabetic diet. Meal planning includes avoidance of concentrated sweets. She has not had a previous visit with a dietician. She participates in exercise every other day. Her home blood glucose trend is decreasing steadily. Her breakfast blood glucose range is generally 90-110 mg/dl. Her dinner blood glucose range is generally 110-130 mg/dl. Her highest blood glucose is 110-130 mg/dl. Her overall blood glucose range is 90-110 mg/dl. An ACE inhibitor/angiotensin II receptor blocker is being taken.      Review of Systems  Constitutional: Negative for fever, chills, weight loss, diaphoresis, activity change, appetite change, fatigue and unexpected weight change.  Eyes: Negative for blurred vision.  Respiratory: Negative for apnea, cough, choking, chest tightness, shortness of breath, wheezing and stridor.   Cardiovascular: Negative for chest pain, palpitations and leg swelling.  Gastrointestinal: Negative for nausea, vomiting, abdominal pain, diarrhea, constipation, blood in stool and abdominal distention.  Genitourinary: Negative for urgency, polyuria, frequency, hematuria and decreased urine volume.   Musculoskeletal: Negative for myalgias, back pain, joint swelling, arthralgias and gait problem.  Skin: Negative for color change, pallor and rash.  Neurological: Negative for dizziness, tremors, syncope, weakness, light-headedness, numbness and headaches.  Hematological: Negative for polydipsia, polyphagia and adenopathy. Does not bruise/bleed easily.  Psychiatric/Behavioral: Negative for behavioral problems, confusion and agitation.       Lab Results  Component Value Date   WBC 4.9 08/17/2010   HGB 13.1 08/17/2010   HCT 38.1 08/17/2010   PLT 225 08/17/2010   CHOL 163 09/24/2007   TRIG 108 09/24/2007   HDL 40 09/24/2007   ALT 31 10/13/2010   AST 23 10/13/2010   NA 141 10/27/2010   K 3.6 10/27/2010   CL 103 10/27/2010   CREATININE 0.7 10/27/2010   BUN 16 10/27/2010   CO2 30 10/27/2010   TSH 2.35 10/13/2010   HGBA1C 6.6* 10/13/2010   Objective:   Physical Exam  Nursing note and vitals reviewed. Constitutional: She is oriented to person, place, and time. She appears well-developed and well-nourished. No distress.  HENT:  Head: Normocephalic and atraumatic.  Right Ear: External ear normal.  Left Ear: External ear normal.  Nose: Nose normal.  Mouth/Throat: Oropharynx is clear and moist. No oropharyngeal exudate.  Eyes: Conjunctivae and EOM are normal. Pupils are equal, round, and reactive to light. Right eye exhibits no discharge. Left eye exhibits no discharge. No scleral icterus.  Neck: Normal range of motion. Neck supple. No thyromegaly present.  Cardiovascular: Normal rate, regular rhythm, normal heart sounds and intact distal pulses.  Exam reveals no gallop and no friction rub.   No murmur heard. Pulmonary/Chest: Effort normal and breath sounds normal. No respiratory distress. She has no wheezes. She has no rales. She exhibits no tenderness.  Abdominal: Soft. Bowel sounds are normal. She  exhibits no distension and no mass. There is no rebound and no guarding.  Musculoskeletal: She exhibits  no edema and no tenderness.  Lymphadenopathy:    She has no cervical adenopathy.  Neurological: She is alert and oriented to person, place, and time. She has normal reflexes. No cranial nerve deficit. Coordination normal.  Skin: Skin is warm and dry. No rash noted. She is not diaphoretic. No erythema. No pallor.  Psychiatric: She has a normal mood and affect. Her behavior is normal. Judgment and thought content normal.          Assessment & Plan:

## 2010-12-30 LAB — COMPREHENSIVE METABOLIC PANEL
Albumin: 3.9 g/dL (ref 3.5–5.2)
BUN: 12 mg/dL (ref 6–23)
Calcium: 10.5 mg/dL (ref 8.4–10.5)
Chloride: 93 mEq/L — ABNORMAL LOW (ref 96–112)
Creatinine, Ser: 0.96 mg/dL (ref 0.4–1.2)
GFR calc Af Amer: >60 mL/min (ref >60)
Total Bilirubin: 0.5 mg/dL (ref 0.3–1.2)

## 2010-12-30 LAB — LIPASE, BLOOD: Lipase: 45 U/L (ref 11–59)

## 2010-12-30 LAB — POCT CARDIAC MARKERS
CKMB, poc: <1 ng/mL — ABNORMAL LOW (ref 1.0–8.0)
Troponin i, poc: <0.05 ng/mL (ref 0.00–0.09)

## 2010-12-30 LAB — CBC
HCT: 41.8 % (ref 36.0–46.0)
MCHC: 34.9 g/dL (ref 30.0–36.0)
MCV: 85.3 fL (ref 78.0–100.0)
Platelets: 195 10*3/uL (ref 150–400)
RDW: 13.2 % (ref 11.5–15.5)
WBC: 6.7 10*3/uL (ref 4.0–10.5)

## 2010-12-30 LAB — URINE CULTURE: Colony Count: 2000

## 2010-12-30 LAB — DIFFERENTIAL
Basophils Absolute: 0 10*3/uL (ref 0.0–0.1)
Lymphocytes Relative: 36 % (ref 12–46)
Lymphs Abs: 2.4 10*3/uL (ref 0.7–4.0)
Monocytes Absolute: 0.6 10*3/uL (ref 0.1–1.0)
Neutro Abs: 3.5 10*3/uL (ref 1.7–7.7)

## 2010-12-30 LAB — POCT I-STAT, CHEM 8
BUN: 15 mg/dL (ref 6–23)
Chloride: 95 mEq/L — ABNORMAL LOW (ref 96–112)
Glucose, Bld: 120 mg/dL — ABNORMAL HIGH (ref 70–99)
HCT: 44 % (ref 36.0–46.0)
Potassium: 4.7 mEq/L (ref 3.5–5.1)

## 2010-12-30 LAB — URINALYSIS, ROUTINE W REFLEX MICROSCOPIC
Bilirubin Urine: NEGATIVE
Glucose, UA: NEGATIVE mg/dL
Ketones, ur: 40 mg/dL — AB
Protein, ur: NEGATIVE mg/dL
pH: 6.5 (ref 5.0–8.0)

## 2010-12-30 LAB — URINE MICROSCOPIC-ADD ON

## 2011-01-03 LAB — DIFFERENTIAL
Basophils Absolute: 0.1 10*3/uL (ref 0.0–0.1)
Basophils Relative: 1 % (ref 0–1)
Neutro Abs: 5 10*3/uL (ref 1.7–7.7)
Neutrophils Relative %: 64 % (ref 43–77)

## 2011-01-03 LAB — CBC
MCHC: 33.3 g/dL (ref 30.0–36.0)
RDW: 13.4 % (ref 11.5–15.5)

## 2011-01-03 LAB — TROPONIN I
Troponin I: 0.01 ng/mL (ref 0.00–0.06)
Troponin I: 0.01 ng/mL (ref 0.00–0.06)

## 2011-01-03 LAB — POCT I-STAT, CHEM 8
Calcium, Ion: 1.25 mmol/L (ref 1.12–1.32)
Chloride: 101 mEq/L (ref 96–112)
Glucose, Bld: 233 mg/dL — ABNORMAL HIGH (ref 70–99)
HCT: 48 % — ABNORMAL HIGH (ref 36.0–46.0)
Hemoglobin: 16.3 g/dL — ABNORMAL HIGH (ref 12.0–15.0)
TCO2: 32 mmol/L (ref 0–100)

## 2011-01-03 LAB — GLUCOSE, CAPILLARY: Glucose-Capillary: 135 mg/dL — ABNORMAL HIGH (ref 70–99)

## 2011-01-03 LAB — CARDIAC PANEL(CRET KIN+CKTOT+MB+TROPI)
Relative Index: INVALID (ref 0.0–2.5)
Total CK: 68 U/L (ref 7–177)

## 2011-01-03 LAB — POCT CARDIAC MARKERS
CKMB, poc: <1 ng/mL — ABNORMAL LOW (ref 1.0–8.0)
Myoglobin, poc: 49.6 ng/mL (ref 12–200)

## 2011-01-03 LAB — CK TOTAL AND CKMB (NOT AT ARMC): Relative Index: INVALID (ref 0.0–2.5)

## 2011-01-17 ENCOUNTER — Telehealth: Payer: Self-pay

## 2011-01-17 NOTE — Telephone Encounter (Signed)
[facility] Triage Call Report Triage Record Num: [id number] Operator: Audelia Hives Patient Name: [name] Call Date & Time: 01/15/2011 9:21:49AM Patient Phone: [phone] PCP: Sanda Linger Patient Gender: Female PCP Fax : Patient DOB: June 27, 1946 Practice Name: [facility] Reason for Call: Dwana Curd calling regarding script for needles to use with Victoza, states when she got her refill 2 weeks ago it does not come with the needles and states she is out of the samples given by office. Per offiec SO authorized 1 refill on BD needles #100. Will f/u with office for further refills, called to Kyle/Rph at [facility] [phone]. Protocol(s) Used: Office Note Recommended Outcome per Protocol: Information Noted and Sent to Office Reason for Outcome: Caller information to office Care Advice: ~ 01/15/2011 9:50:14AM Page 1 of 1 [email or web address]

## 2011-02-01 NOTE — Cardiovascular Report (Signed)
NAME:  [name], [name]                 ACCOUNT NO.:  [id number]   MEDICAL RECORD NO.:  [id number]          PATIENT TYPE:  OIB   LOCATION:  2852                         FACILITY:  [facility]   PHYSICIAN:  Ricki Rodriguez, M.D.  DATE OF BIRTH:  10/27/1945   DATE OF PROCEDURE:  10/12/2007  DATE OF DISCHARGE:                            CARDIAC CATHETERIZATION   LEFT HEART CATHETERIZATION, SELECTIVE CORONARY ANGIOGRAPHY, LEFT  VENTRICULAR FUNCTION STUDY:   INDICATIONS:  This 65 year old black female with cardiac risk factors of  diabetes and hypertension and hyperlipidemia had abnormal stress test  with wheelchair propulsion, ST-depressions and frequent PVC.   APPROACH:  Right femoral artery, using #4 French sheath and catheters.   COMPLICATIONS:  None.   45 mL of dye was used.   HEMODYNAMIC DATA:  The left ventricular pressure was 154/6 and aortic  pressure was 156/24.   LEFT VENTRICULOGRAM:  Left ventriculogram showed hyperdynamic wall  motion with ejection fraction of 75%.   CORONARY ANATOMY:  The left main coronary artery showed calcification,  otherwise unremarkable.   LEFT ANTERIOR DESCENDING CORONARY ARTERY:  The left anterior descending  coronary artery also showed minimal calcification and some luminal  irregularities in the proximal one-third of the vessel.  The diagonal  vessel was unremarkable.   LEFT CIRCUMFLEX CORONARY ARTERY:  The left circumflex coronary artery  was unremarkable.  It had a narrow ramus branch and obtuse marginal  branches 1 and 2 were larger vessels.   RIGHT CORONARY ARTERY:  The right coronary artery showed proximal half  luminal irregularities.  The rest of the vessel was unremarkable.  This  posterolateral branch was unremarkable and posterior descending coronary  artery had ostial 10-20% lesion.   IMPRESSION:  1. Minimal multivessel native vessel coronary artery disease.  2. Hyperdynamic left ventricular systolic function.   RECOMMENDATIONS:   This patient will continue medical therapy.      Ricki Rodriguez, M.D.  Electronically Signed     ASK/MEDQ  D:  10/12/2007  T:  10/12/2007  Job:  [id number]

## 2011-02-22 ENCOUNTER — Telehealth: Payer: Self-pay | Admitting: *Deleted

## 2011-02-22 NOTE — Telephone Encounter (Signed)
Left detailed vm on pt's hm # (ok per HIPPA form) to call office and schedule OV w/MD for eval of heel problem

## 2011-02-22 NOTE — Telephone Encounter (Signed)
Patient requesting a call back regarding a problem with her heel.

## 2011-02-24 ENCOUNTER — Encounter: Payer: Self-pay | Admitting: Internal Medicine

## 2011-02-24 ENCOUNTER — Ambulatory Visit (INDEPENDENT_AMBULATORY_CARE_PROVIDER_SITE_OTHER): Payer: Medicare Other | Admitting: Internal Medicine

## 2011-02-24 VITALS — BP 130/80 | HR 69 | Temp 98.7°F | Resp 16 | Wt 174.0 lb

## 2011-02-24 DIAGNOSIS — Z23 Encounter for immunization: Secondary | ICD-10-CM

## 2011-02-24 DIAGNOSIS — I10 Essential (primary) hypertension: Secondary | ICD-10-CM

## 2011-02-24 DIAGNOSIS — Z2911 Encounter for prophylactic immunotherapy for respiratory syncytial virus (RSV): Secondary | ICD-10-CM

## 2011-02-24 DIAGNOSIS — M722 Plantar fascial fibromatosis: Secondary | ICD-10-CM

## 2011-02-24 MED ORDER — CELECOXIB 200 MG PO CAPS: 200.0000 mg | ORAL_CAPSULE | Freq: Every day | ORAL | Status: DC

## 2011-02-24 NOTE — Patient Instructions (Signed)

## 2011-02-25 DIAGNOSIS — M722 Plantar fascial fibromatosis: Secondary | ICD-10-CM | POA: Insufficient documentation

## 2011-02-25 NOTE — Progress Notes (Signed)
Subjective:    Patient ID: [name], female    DOB: [birth year], 65 y.o.   MRN: [id number]  HPI She returns c/o non-traumatic heel and foot pain that occurs only with weight-bearing activity.   Review of Systems  Constitutional: Negative for fever, chills, diaphoresis, activity change, appetite change, fatigue and unexpected weight change.  HENT: Negative for facial swelling, neck pain and neck stiffness.   Eyes: Negative for photophobia and visual disturbance.  Respiratory: Negative for apnea, cough, choking, chest tightness, shortness of breath, wheezing and stridor.   Cardiovascular: Negative for palpitations and leg swelling.  Gastrointestinal: Negative for nausea, vomiting, abdominal pain, diarrhea, constipation, blood in stool and abdominal distention.  Musculoskeletal: Positive for arthralgias. Negative for myalgias, back pain, joint swelling and gait problem.  Skin: Negative for color change, pallor and rash.  Neurological: Negative for dizziness, tremors, seizures, syncope, facial asymmetry, speech difficulty, weakness, light-headedness, numbness and headaches.  Hematological: Negative for adenopathy. Does not bruise/bleed easily.  Psychiatric/Behavioral: Negative.        Objective:   Physical Exam  Vitals reviewed. Constitutional: She is oriented to person, place, and time. She appears well-developed and well-nourished. No distress.  HENT:  Head: Normocephalic and atraumatic.  Right Ear: External ear normal.  Left Ear: External ear normal.  Nose: Nose normal.  Mouth/Throat: Oropharynx is clear and moist. No oropharyngeal exudate.  Eyes: Conjunctivae and EOM are normal. Pupils are equal, round, and reactive to light. Right eye exhibits no discharge. Left eye exhibits no discharge. No scleral icterus.  Neck: Normal range of motion. Neck supple. No JVD present. No tracheal deviation present. No thyromegaly present.  Cardiovascular: Normal rate, regular rhythm, normal heart  sounds and intact distal pulses.  Exam reveals no gallop and no friction rub.   No murmur heard. Pulmonary/Chest: Effort normal and breath sounds normal. No stridor. No respiratory distress. She has no wheezes. She has no rales. She exhibits no tenderness.  Abdominal: Soft. Bowel sounds are normal. She exhibits no distension and no mass. There is no tenderness. There is no rebound and no guarding.  Musculoskeletal: Normal range of motion. She exhibits no edema and no tenderness.       Right foot: Normal. She exhibits normal range of motion, no tenderness, no bony tenderness, no swelling, normal capillary refill, no crepitus, no deformity and no laceration.       Left foot: Normal. She exhibits normal range of motion, no tenderness, no bony tenderness, no swelling, normal capillary refill, no crepitus, no deformity and no laceration.  Lymphadenopathy:    She has no cervical adenopathy.  Neurological: She is alert and oriented to person, place, and time. She has normal reflexes.  Skin: Skin is warm and dry. No rash noted. She is not diaphoretic. No erythema. No pallor.  Psychiatric: She has a normal mood and affect. Her behavior is normal. Judgment and thought content normal.        Lab Results  Component Value Date   WBC 4.9 08/17/2010   HGB 13.1 08/17/2010   HCT 38.1 08/17/2010   PLT 225 08/17/2010   CHOL 163 09/24/2007   TRIG 108 09/24/2007   HDL 40 09/24/2007   ALT 31 10/13/2010   AST 23 10/13/2010   NA 141 10/27/2010   K 3.6 10/27/2010   CL 103 10/27/2010   CREATININE 0.7 10/27/2010   BUN 16 10/27/2010   CO2 30 10/27/2010   TSH 2.35 10/13/2010   HGBA1C 6.6* 10/13/2010    Assessment & Plan:

## 2011-02-25 NOTE — Assessment & Plan Note (Signed)
Her BP is well controlled 

## 2011-02-25 NOTE — Assessment & Plan Note (Signed)
Start celebrex, get more supportive shoes and inserts, she was given pt ed material as well

## 2011-04-06 ENCOUNTER — Other Ambulatory Visit: Payer: Self-pay | Admitting: Internal Medicine

## 2011-04-06 ENCOUNTER — Encounter: Payer: Self-pay | Admitting: Internal Medicine

## 2011-04-06 ENCOUNTER — Ambulatory Visit (INDEPENDENT_AMBULATORY_CARE_PROVIDER_SITE_OTHER): Payer: Medicare Other | Admitting: Internal Medicine

## 2011-04-06 ENCOUNTER — Other Ambulatory Visit (INDEPENDENT_AMBULATORY_CARE_PROVIDER_SITE_OTHER): Payer: Medicare Other

## 2011-04-06 DIAGNOSIS — I10 Essential (primary) hypertension: Secondary | ICD-10-CM

## 2011-04-06 DIAGNOSIS — E785 Hyperlipidemia, unspecified: Secondary | ICD-10-CM

## 2011-04-06 DIAGNOSIS — E119 Type 2 diabetes mellitus without complications: Secondary | ICD-10-CM

## 2011-04-06 DIAGNOSIS — E876 Hypokalemia: Secondary | ICD-10-CM

## 2011-04-06 DIAGNOSIS — M7660 Achilles tendinitis, unspecified leg: Secondary | ICD-10-CM

## 2011-04-06 DIAGNOSIS — M1990 Unspecified osteoarthritis, unspecified site: Secondary | ICD-10-CM

## 2011-04-06 LAB — COMPREHENSIVE METABOLIC PANEL
AST: 26 U/L (ref 0–37)
Alkaline Phosphatase: 58 U/L (ref 39–117)
BUN: 15 mg/dL (ref 6–23)
Calcium: 9.8 mg/dL (ref 8.4–10.5)
Creatinine, Ser: 0.8 mg/dL (ref 0.4–1.2)
Glucose, Bld: 131 mg/dL — ABNORMAL HIGH (ref 70–99)

## 2011-04-06 LAB — CBC WITH DIFFERENTIAL/PLATELET
Basophils Absolute: 0 10*3/uL (ref 0.0–0.1)
Basophils Relative: 0.6 % (ref 0.0–3.0)
Eosinophils Absolute: 0.1 10*3/uL (ref 0.0–0.7)
MCHC: 34.4 g/dL (ref 30.0–36.0)
MCV: 85.3 fl (ref 78.0–100.0)
Monocytes Absolute: 0.4 10*3/uL (ref 0.1–1.0)
Neutrophils Relative %: 54.8 % (ref 43.0–77.0)
Platelets: 191 10*3/uL (ref 150.0–400.0)
RDW: 13.9 % (ref 11.5–14.6)

## 2011-04-06 LAB — TSH: TSH: 1.41 u[IU]/mL (ref 0.35–5.50)

## 2011-04-06 LAB — LIPID PANEL
Cholesterol: 155 mg/dL (ref 0–200)
HDL: 45.9 mg/dL (ref >39.00)
VLDL: 15.6 mg/dL (ref 0.0–40.0)

## 2011-04-06 LAB — HEMOGLOBIN A1C: Hgb A1c MFr Bld: 6.6 % — ABNORMAL HIGH (ref 4.6–6.5)

## 2011-04-06 MED ORDER — POTASSIUM CHLORIDE CRYS ER 15 MEQ PO TBCR: 15.0000 meq | EXTENDED_RELEASE_TABLET | Freq: Two times a day (BID) | ORAL | Status: DC

## 2011-04-06 MED ORDER — NAPROXEN 375 MG PO TABS: 375.0000 mg | ORAL_TABLET | Freq: Two times a day (BID) | ORAL | Status: DC

## 2011-04-06 NOTE — Assessment & Plan Note (Signed)
She tells me that celebrex did not help much so I will offer her an RX for naproxen

## 2011-04-06 NOTE — Progress Notes (Signed)
Subjective:    Patient ID: [name], female    DOB: Jul 22, 1946, 65 y.o.   MRN: [id number]  Diabetes She presents for her follow-up diabetic visit. She has type 2 diabetes mellitus. Her disease course has been stable. There are no hypoglycemic associated symptoms. Pertinent negatives for hypoglycemia include no confusion, dizziness, headaches, nervousness/anxiousness, pallor, seizures, speech difficulty, sweats or tremors. Pertinent negatives for diabetes include no blurred vision, no chest pain, no fatigue, no foot paresthesias, no foot ulcerations, no polydipsia, no polyphagia, no polyuria, no visual change, no weakness and no weight loss. There are no hypoglycemic complications. Symptoms are stable. There are no diabetic complications. Current diabetic treatment includes oral agent (monotherapy). She is compliant with treatment all of the time. Her weight is stable. She is following a generally healthy diet. Meal planning includes avoidance of concentrated sweets. She has not had a previous visit with a dietician. She participates in exercise intermittently. There is no change in her home blood glucose trend. Her breakfast blood glucose range is generally 110-130 mg/dl. Her lunch blood glucose range is generally 130-140 mg/dl. Her dinner blood glucose range is generally 130-140 mg/dl. Her highest blood glucose is 180-200 mg/dl. Her overall blood glucose range is 130-140 mg/dl. An ACE inhibitor/angiotensin II receptor blocker is being taken. She does not see a podiatrist.Eye exam is not current.  Hypertension This is a chronic problem. The current episode started more than 1 year ago. The problem has been gradually improving since onset. The problem is controlled. Pertinent negatives include no anxiety, blurred vision, chest pain, headaches, malaise/fatigue, neck pain, orthopnea, palpitations, peripheral edema, PND, shortness of breath or sweats. There are no associated agents to hypertension. Risk factors  for coronary artery disease include diabetes mellitus. Past treatments include calcium channel blockers, diuretics and angiotensin blockers. The current treatment provides significant improvement. Compliance problems include exercise and diet.       Review of Systems  Constitutional: Negative for fever, chills, weight loss, malaise/fatigue, diaphoresis, activity change, appetite change, fatigue and unexpected weight change.  HENT: Negative for sore throat, facial swelling, trouble swallowing, neck pain and voice change.   Eyes: Negative for blurred vision, photophobia and visual disturbance.  Respiratory: Negative for apnea, cough, choking, chest tightness, shortness of breath, wheezing and stridor.   Cardiovascular: Negative for chest pain, palpitations, orthopnea, leg swelling and PND.  Gastrointestinal: Negative for nausea, vomiting, abdominal pain, diarrhea and constipation.  Genitourinary: Negative for dysuria, urgency, polyuria, frequency, hematuria, flank pain, decreased urine volume, enuresis, difficulty urinating and dyspareunia.  Musculoskeletal: Positive for arthralgias (she has pain behind her right ankle over the achilles tendon). Negative for myalgias, back pain, joint swelling and gait problem.  Skin: Negative for color change, pallor, rash and wound.  Neurological: Negative for dizziness, tremors, seizures, syncope, facial asymmetry, speech difficulty, weakness, light-headedness, numbness and headaches.  Hematological: Negative for polydipsia, polyphagia and adenopathy. Does not bruise/bleed easily.  Psychiatric/Behavioral: Negative for suicidal ideas, hallucinations, behavioral problems, confusion, sleep disturbance, self-injury, dysphoric mood, decreased concentration and agitation. The patient is not nervous/anxious and is not hyperactive.        Objective:   Physical Exam  Vitals reviewed. Constitutional: She is oriented to person, place, and time. She appears  well-developed and well-nourished. No distress.  HENT:  Head: Normocephalic and atraumatic.  Right Ear: External ear normal.  Left Ear: External ear normal.  Nose: Nose normal.  Mouth/Throat: Oropharynx is clear and moist. No oropharyngeal exudate.  Eyes: Conjunctivae and EOM are normal. Pupils are  equal, round, and reactive to light. Right eye exhibits no discharge. Left eye exhibits no discharge. No scleral icterus.  Neck: Normal range of motion. Neck supple. No JVD present. No tracheal deviation present. No thyromegaly present.  Cardiovascular: Normal rate, regular rhythm, normal heart sounds and intact distal pulses.  Exam reveals no gallop and no friction rub.   No murmur heard. Pulmonary/Chest: Effort normal and breath sounds normal. No stridor. No respiratory distress. She has no wheezes. She has no rales. She exhibits no tenderness.  Abdominal: Soft. Bowel sounds are normal. She exhibits no distension and no mass. There is no tenderness. There is no rebound and no guarding.  Musculoskeletal: Normal range of motion. She exhibits tenderness. She exhibits no edema.       Right foot: She exhibits tenderness (she has mild ttp over the right achilles). She exhibits normal range of motion, no bony tenderness, no swelling, normal capillary refill, no crepitus, no deformity and no laceration.  Lymphadenopathy:    She has no cervical adenopathy.  Neurological: She is alert and oriented to person, place, and time. She has normal reflexes. She displays normal reflexes. No cranial nerve deficit. She exhibits normal muscle tone. Coordination normal.  Skin: Skin is warm and dry. No rash noted. She is not diaphoretic. No erythema. No pallor.  Psychiatric: She has a normal mood and affect. Her behavior is normal. Judgment and thought content normal.        Lab Results  Component Value Date   WBC 4.9 08/17/2010   HGB 13.1 08/17/2010   HCT 38.1 08/17/2010   PLT 225 08/17/2010   CHOL 163 09/24/2007     TRIG 108 09/24/2007   HDL 40 09/24/2007   ALT 31 10/13/2010   AST 23 10/13/2010   NA 141 10/27/2010   K 3.6 10/27/2010   CL 103 10/27/2010   CREATININE 0.7 10/27/2010   BUN 16 10/27/2010   CO2 30 10/27/2010   TSH 2.35 10/13/2010   HGBA1C 6.6* 10/13/2010    Assessment & Plan:

## 2011-04-06 NOTE — Patient Instructions (Signed)
Achilles Tendonitis (Tendinitis) Tendonitis a swelling and soreness of the tendon. The pain in the tendon (cord like structure which attaches muscle to bone) is produced by tiny tears and the inflammation present in that tendon. It commonly occurs at the shoulders, heels, and elbows. It is usually caused by overusing the tendon and joint involved. Achilles tendonitis involves the Achilles tendon. This is the large tendon in the back of the leg just above the foot. It attaches the large muscles of the lower leg to the heel bone (called calcaneus).  This diagnosis (learning what is wrong) is made by examination. X-rays will be generally be normal if only tendonitis is present. HOME CARE INSTRUCTIONS Apply ice to the injury for 20 minutes 2Diabetes, Type 2 Diabetes is a lasting (chronic) disease. In type 2 diabetes, the pancreas does not make enough insulin (a hormone), and the body does not respond normally to the insulin that is made. This type of diabetes was also previously called adult onset diabetes. About 90% of all those who have diabetes have type 2. It usually occurs after the age of 2 but can occur at any age. CAUSES Unlike type 1 diabetes, which happens because insulin is no longer being made, type 2 diabetes happens because the body is making less insulin and has trouble using the insulin properly. SYMPTOMS Drinking more than usual.  Urinating more than usual.  Blurred vision.  Dry, itchy skin.  Frequent infection like yeast infections in women.  More tired than usual (fatigue).  TREATMENT Healthy eating.  Exercise.  Medication, if needed.  Monitoring blood glucose (sugar).  Seeing your caregiver regularly.  HOME CARE INSTRUCTIONS Check your blood glucose (sugar) at least once daily. More frequent monitoring may be necessary, depending on your medications and on how well your diabetes is controlled. Your caregiver will advise you.  Take your medicine as directed by your caregiver.    Do not smoke.  Make wise food choices. Ask your caregiver for information. Weight loss can improve your diabetes.  Learn about low blood glucose (hypoglycemia) and how to treat it.  Get your eyes checked regularly.  Have a yearly physical exam. Have your blood pressure checked. Get your blood and urine tested.  Wear a pendant or bracelet saying that you have diabetes.  Check your feet every night for sores. Let your caregiver know if you have sores that are not healing.  SEEK MEDICAL CARE IF: You are having problems keeping your blood glucose at target range.  You feel you might be having problems with your medicines.  You have symptoms of an illness that is not improving after 24 hours.  You have a sore or wound that is not healing.  You notice a change in vision or a new problem with your vision.  You develop a fever of more than 100.5.  Document Released: 09/05/2005 Document Re-Released: 09/27/2009  [facility] Patient Information 2011 [place], [place]. times per day. Put the ice in a plastic bag and place a towel between the bag of ice and your skin.   Try to avoid use other than gentle range of motion while the tendon is painful. Do not resume use until instructed by your caregiver. Then begin use gradually. Do not increase use to the point of pain. If pain does develop, decrease use and continue the above measures. Gradually increase activities that do not cause discomfort until you gradually achieve normal use.   Only take over-the-counter or prescription medicines for pain, discomfort, or fever  as directed by your caregiver.  SEEK MEDICAL CARE IF:  Your pain and swelling increase or pain is uncontrolled with medications.   You develop new, unexplained problems (symptoms) or an increase of the symptoms that brought you to your caregiver.   You develop an inability to move your toes or foot, develop warmth and swelling in your foot, or begin running an unexplained temperature.  MAKE  SURE YOU:   Understand these instructions.   Will watch your condition.   Will get help right away if you are not doing well or get worse.  Document Released: 06/15/2005 Document Re-Released: 12/02/2008 [facility] Patient Information 2011 [place], [place].

## 2011-04-06 NOTE — Assessment & Plan Note (Signed)
Her BP is well controlled, I will monitor her lytes and renal function today 

## 2011-04-06 NOTE — Assessment & Plan Note (Signed)
I will check her A1C and see how well victoza is working

## 2011-04-06 NOTE — Assessment & Plan Note (Signed)
She is doing well on crestor 

## 2011-04-06 NOTE — Assessment & Plan Note (Signed)
Start nsaid therpy and refer for PT

## 2011-04-11 ENCOUNTER — Other Ambulatory Visit: Payer: Self-pay | Admitting: Internal Medicine

## 2011-04-11 LAB — HM DIABETES EYE EXAM: HM Diabetic Eye Exam: NORMAL

## 2011-04-29 ENCOUNTER — Other Ambulatory Visit: Payer: Self-pay | Admitting: Internal Medicine

## 2011-05-10 ENCOUNTER — Other Ambulatory Visit (INDEPENDENT_AMBULATORY_CARE_PROVIDER_SITE_OTHER): Payer: Medicare Other

## 2011-05-10 ENCOUNTER — Ambulatory Visit (INDEPENDENT_AMBULATORY_CARE_PROVIDER_SITE_OTHER): Payer: Medicare Other | Admitting: Internal Medicine

## 2011-05-10 ENCOUNTER — Encounter: Payer: Self-pay | Admitting: Internal Medicine

## 2011-05-10 VITALS — BP 148/80 | HR 69 | Temp 98.5°F | Resp 16 | Wt 172.0 lb

## 2011-05-10 DIAGNOSIS — E876 Hypokalemia: Secondary | ICD-10-CM

## 2011-05-10 DIAGNOSIS — J309 Allergic rhinitis, unspecified: Secondary | ICD-10-CM

## 2011-05-10 DIAGNOSIS — I10 Essential (primary) hypertension: Secondary | ICD-10-CM

## 2011-05-10 LAB — BASIC METABOLIC PANEL
CO2: 31 mEq/L (ref 19–32)
Calcium: 10.1 mg/dL (ref 8.4–10.5)
Chloride: 105 mEq/L (ref 96–112)
Creatinine, Ser: 0.9 mg/dL (ref 0.4–1.2)
Sodium: 141 mEq/L (ref 135–145)

## 2011-05-10 LAB — MAGNESIUM: Magnesium: 2 mg/dL (ref 1.5–2.5)

## 2011-05-10 MED ORDER — MOMETASONE FUROATE 50 MCG/ACT NA SUSP: 2.0000 | Freq: Every day | NASAL | Status: DC

## 2011-05-10 NOTE — Patient Instructions (Signed)
Hypertension (High Blood Pressure) As your heart beats, it forces blood through your arteries. This force is your blood pressure. If the pressure is too high, it is called hypertension (HTN) or high blood pressure. HTN is dangerous because you may have it and not know it. High blood pressure may mean that your heart has to work harder to pump blood. Your arteries may be narrow or stiff. The extra work puts you at risk for heart disease, stroke, and other problems.  Blood pressure consists of two numbers, a higher number over a lower, 110/72, for example. It is stated as "110 over 72." The ideal is below 120 for the top number (systolic) and under 80 for the bottom (diastolic). Write down your blood pressure today. You should pay close attention to your blood pressure if you have certain conditions such as:  Heart failure.  Prior heart attack.   Diabetes   Chronic kidney disease.   Prior stroke.   Multiple risk factors for heart disease.   To see if you have HTN, your blood pressure should be measured while you are seated with your arm held at the level of the heart. It should be measured at least twice. A one-time elevated blood pressure reading (especially in the Emergency Department) does not mean that you need treatment. There may be conditions in which the blood pressure is different between your right and left arms. It is important to see your caregiver soon for a recheck. Most people have essential hypertension which means that there is not a specific cause. This type of high blood pressure may be lowered by changing lifestyle factors such as:  Stress.  Smoking.   Lack of exercise.   Excessive weight.  Drug/tobacco/alcohol use.   Eating less salt.   Most people do not have symptoms from high blood pressure until it has caused damage to the body. Effective treatment can often prevent, delay or reduce that damage. TREATMENT Treatment for high blood pressure, when a cause has been  identified, is directed at the cause. There are a large number of medications to treat HTN. These fall into several categories, and your caregiver will help you select the medicines that are best for you. Medications may have side effects. You should review side effects with your caregiver. If your blood pressure stays high after you have made lifestyle changes or started on medicines,   Your medication(s) may need to be changed.   Other problems may need to be addressed.   Be certain you understand your prescriptions, and know how and when to take your medicine.   Be sure to follow up with your caregiver within the time frame advised (usually within two weeks) to have your blood pressure rechecked and to review your medications.   If you are taking more than one medicine to lower your blood pressure, make sure you know how and at what times they should be taken. Taking two medicines at the same time can result in blood pressure that is too low.  SEEK IMMEDIATE MEDICAL CARE IF YOU DEVELOP:  A severe headache, blurred or changing vision, or confusion.   Unusual weakness or numbness, or a faint feeling.   Severe chest or abdominal pain, vomiting, or breathing problems.  MAKE SURE YOU:   Understand these instructions.   Will watch your condition.   Will get help right away if you are not doing well or get worse.  Document Released: 09/05/2005 Document Re-Released: 02/23/2010 [employer] Patient Information 2011 [employer].Allergic Rhinitis Allergic rhinitis is when the mucous membranes in the nose respond to allergens. Allergens are particles in the air that cause your body to have an allergic reaction. This causes you to release allergic antibodies. Through a chain of events, these eventually cause you to release histamine into the blood stream (hence the use of antihistamines). Although meant to be protective to the body, it is this release that causes your discomfort, such as frequent  sneezing, congestion and an itchy runny nose.  CAUSES The pollen allergens may come from grasses, trees, and weeds. This is seasonal allergic rhinitis, or "hay fever." Other allergens cause year-round allergic rhinitis (perennial allergic rhinitis) such as house dust mite allergen, pet dander and mold spores.  SYMPTOMS  Nasal stuffiness (congestion).   Runny, itchy nose with sneezing and tearing of the eyes.   There is often an itching of the mouth, eyes and ears.  It cannot be cured, but it can be controlled with medications. DIAGNOSIS If you are unable to determine the offending allergen, skin or blood testing may find it. TREATMENT  Avoid the allergen.   Medications and allergy shots (immunotherapy) can help.   Hay fever may often be treated with antihistamines in pill or nasal spray forms. Antihistamines block the effects of histamine. There are over-the-counter medicines that may help with nasal congestion and swelling around the eyes. Check with your caregiver before taking or giving this medicine.  If the treatment above does not work, there are many new medications your caregiver can prescribe. Stronger medications may be used if initial measures are ineffective. Desensitizing injections can be used if medications and avoidance fails. Desensitization is when a patient is given ongoing shots until the body becomes less sensitive to the allergen. Make sure you follow up with your caregiver if problems continue. SEEK MEDICAL CARE IF:   You develop fever (more than 100.15F (38.1 C).   You develop a cough that does not stop easily (persistent).   You have shortness of breath.   You start wheezing.   Symptoms interfere with normal daily activities.  Document Released: 05/31/2001 Document Re-Released: 09/27/2009 [facility] Patient Information 2011 [place], [place].

## 2011-05-10 NOTE — Assessment & Plan Note (Signed)
I will check K+ and Mg+ levels today

## 2011-05-10 NOTE — Assessment & Plan Note (Signed)
Her BP is well controlled 

## 2011-05-10 NOTE — Progress Notes (Signed)
Subjective:    Patient ID: [name], female    DOB: 1946/05/13, 65 y.o.   MRN: [id number]  Allergic Reaction This is a new problem. The current episode started more than 1 week ago. The problem occurs intermittently. The problem is unchanged. The problem is mild. It is unknown what she was exposed to. The time of exposure is not relevant (no exposure). Associated symptoms include eye itching. Pertinent negatives include no abdominal pain, chest pain, chest pressure, coughing, diarrhea, drooling, eye redness, eye watering, globus sensation, hyperventilation, itching, rash, stridor, trouble swallowing, vomiting or wheezing. There is no swelling present. Past treatments include nothing.  Hypertension This is a chronic problem. The current episode started more than 1 year ago. The problem has been gradually improving since onset. The problem is controlled. Pertinent negatives include no anxiety, blurred vision, chest pain, headaches, malaise/fatigue, neck pain, orthopnea, palpitations, peripheral edema, PND, shortness of breath or sweats. There are no associated agents to hypertension. Past treatments include beta blockers, diuretics, calcium channel blockers and angiotensin blockers. The current treatment provides significant improvement. Compliance problems include diet and exercise.       Review of Systems  Constitutional: Negative for fever, chills, malaise/fatigue, diaphoresis, activity change, appetite change, fatigue and unexpected weight change.  HENT: Positive for congestion, rhinorrhea, sneezing, voice change and postnasal drip. Negative for hearing loss, ear pain, nosebleeds, sore throat, facial swelling, drooling, mouth sores, trouble swallowing, neck pain, neck stiffness, dental problem, sinus pressure, tinnitus and ear discharge.   Eyes: Positive for itching. Negative for blurred vision, photophobia, pain, discharge, redness and visual disturbance.  Respiratory: Negative for apnea, cough,  choking, chest tightness, shortness of breath, wheezing and stridor.   Cardiovascular: Negative for chest pain, palpitations, orthopnea, leg swelling and PND.  Gastrointestinal: Negative for nausea, vomiting, abdominal pain, diarrhea, constipation, blood in stool and abdominal distention.  Genitourinary: Negative for dysuria, urgency, frequency, hematuria, flank pain, decreased urine volume, enuresis, difficulty urinating and dyspareunia.  Musculoskeletal: Negative for myalgias, back pain, joint swelling, arthralgias and gait problem.  Skin: Negative for color change, itching, pallor, rash and wound.  Neurological: Negative for dizziness, tremors, seizures, syncope, facial asymmetry, speech difficulty, weakness, light-headedness, numbness and headaches.  Hematological: Negative for adenopathy. Does not bruise/bleed easily.  Psychiatric/Behavioral: Negative for suicidal ideas, hallucinations, behavioral problems, confusion, sleep disturbance, self-injury, dysphoric mood, decreased concentration and agitation. The patient is not nervous/anxious and is not hyperactive.        Objective:   Physical Exam  Vitals reviewed. Constitutional: She is oriented to person, place, and time. She appears well-developed and well-nourished. No distress.  HENT:  Head: Normocephalic and atraumatic. No trismus in the jaw.  Right Ear: Hearing, tympanic membrane, external ear and ear canal normal.  Left Ear: Hearing, tympanic membrane, external ear and ear canal normal.  Nose: Nose normal. No mucosal edema, rhinorrhea, nose lacerations, sinus tenderness, nasal deformity, septal deviation or nasal septal hematoma. No epistaxis.  No foreign bodies. Right sinus exhibits no maxillary sinus tenderness and no frontal sinus tenderness. Left sinus exhibits no maxillary sinus tenderness and no frontal sinus tenderness.  Mouth/Throat: Oropharynx is clear and moist and mucous membranes are normal. Mucous membranes are not pale,  not dry and not cyanotic. No oral lesions. No uvula swelling. No oropharyngeal exudate, posterior oropharyngeal edema, posterior oropharyngeal erythema or tonsillar abscesses.  Eyes: Conjunctivae and EOM are normal. Pupils are equal, round, and reactive to light. Right eye exhibits no discharge. Left eye exhibits no discharge. No scleral icterus.  Neck: Normal range of motion. Neck supple. No JVD present. No tracheal deviation present. No thyromegaly present.  Cardiovascular: Normal rate, regular rhythm and intact distal pulses.  Exam reveals no gallop and no friction rub.   No murmur heard. Pulmonary/Chest: Effort normal and breath sounds normal. No stridor. No respiratory distress. She has no wheezes. She has no rales. She exhibits no tenderness.  Abdominal: Soft. Bowel sounds are normal. She exhibits no distension and no mass. There is no tenderness. There is no rebound and no guarding.  Musculoskeletal: Normal range of motion. She exhibits no edema and no tenderness.  Lymphadenopathy:    She has no cervical adenopathy.  Neurological: She is alert and oriented to person, place, and time. She has normal reflexes. She displays normal reflexes. No cranial nerve deficit. She exhibits normal muscle tone. Coordination normal.  Skin: Skin is warm and dry. No rash noted. She is not diaphoretic. No erythema. No pallor.  Psychiatric: She has a normal mood and affect. Her behavior is normal. Judgment and thought content normal.      Lab Results  Component Value Date   WBC 4.7 04/06/2011   HGB 14.3 04/06/2011   HCT 41.7 04/06/2011   PLT 191.0 04/06/2011   CHOL 155 04/06/2011   TRIG 78.0 04/06/2011   HDL 45.90 04/06/2011   ALT 31 04/06/2011   AST 26 04/06/2011   NA 141 04/06/2011   K 3.2* 04/06/2011   CL 103 04/06/2011   CREATININE 0.8 04/06/2011   BUN 15 04/06/2011   CO2 30 04/06/2011   TSH 1.41 04/06/2011   HGBA1C 6.6* 04/06/2011      Assessment & Plan:

## 2011-05-10 NOTE — Assessment & Plan Note (Signed)
Start a steroid nasal spray

## 2011-06-10 LAB — PROTIME-INR
INR: 0.9
Prothrombin Time: 12.8

## 2011-06-10 LAB — APTT: aPTT: 24

## 2011-06-14 LAB — DIFFERENTIAL
Basophils Absolute: 0
Lymphocytes Relative: 38
Monocytes Absolute: 0.4
Monocytes Relative: 9
Neutro Abs: 2.3

## 2011-06-14 LAB — POCT I-STAT, CHEM 8
BUN: 9
Calcium, Ion: 1.22
Chloride: 104
Creatinine, Ser: 1
Glucose, Bld: 192 — ABNORMAL HIGH
HCT: 46
Hemoglobin: 15.6 — ABNORMAL HIGH
Potassium: 3.3 — ABNORMAL LOW
Sodium: 142
TCO2: 29

## 2011-06-14 LAB — CBC
Hemoglobin: 15.2 — ABNORMAL HIGH
RBC: 5.22 — ABNORMAL HIGH
RDW: 13
WBC: 4.6

## 2011-06-14 LAB — POCT CARDIAC MARKERS
CKMB, poc: 5.5
Myoglobin, poc: 46.7
Operator id: 285491
Troponin i, poc: <0.05

## 2011-07-20 ENCOUNTER — Other Ambulatory Visit: Payer: Self-pay | Admitting: Internal Medicine

## 2011-07-20 ENCOUNTER — Telehealth: Payer: Self-pay

## 2011-07-20 MED ORDER — LANCETS MISC: Status: DC

## 2011-07-20 MED ORDER — GLUCOSE BLOOD VI STRP: ORAL_STRIP | Status: DC

## 2011-07-20 NOTE — Telephone Encounter (Signed)
refill 

## 2011-07-20 NOTE — Telephone Encounter (Signed)
Patient called [facility] requesting samples of tribenzor 40/5/12.5mg . Patient notified/[facility]

## 2011-08-24 ENCOUNTER — Other Ambulatory Visit (INDEPENDENT_AMBULATORY_CARE_PROVIDER_SITE_OTHER): Payer: Medicare Other

## 2011-08-24 ENCOUNTER — Encounter: Payer: Self-pay | Admitting: Internal Medicine

## 2011-08-24 ENCOUNTER — Ambulatory Visit (INDEPENDENT_AMBULATORY_CARE_PROVIDER_SITE_OTHER): Payer: Medicare Other | Admitting: Internal Medicine

## 2011-08-24 VITALS — BP 146/84 | HR 61 | Temp 98.7°F | Resp 16 | Wt 170.5 lb

## 2011-08-24 DIAGNOSIS — E119 Type 2 diabetes mellitus without complications: Secondary | ICD-10-CM

## 2011-08-24 DIAGNOSIS — M1990 Unspecified osteoarthritis, unspecified site: Secondary | ICD-10-CM

## 2011-08-24 DIAGNOSIS — E876 Hypokalemia: Secondary | ICD-10-CM

## 2011-08-24 DIAGNOSIS — E785 Hyperlipidemia, unspecified: Secondary | ICD-10-CM

## 2011-08-24 DIAGNOSIS — Z79899 Other long term (current) drug therapy: Secondary | ICD-10-CM

## 2011-08-24 DIAGNOSIS — I10 Essential (primary) hypertension: Secondary | ICD-10-CM

## 2011-08-24 DIAGNOSIS — Z23 Encounter for immunization: Secondary | ICD-10-CM

## 2011-08-24 LAB — CBC WITH DIFFERENTIAL/PLATELET
Basophils Relative: 0.6 % (ref 0.0–3.0)
Eosinophils Relative: 1.5 % (ref 0.0–5.0)
HCT: 44.2 % (ref 36.0–46.0)
Hemoglobin: 15.1 g/dL — ABNORMAL HIGH (ref 12.0–15.0)
Lymphs Abs: 1.7 10*3/uL (ref 0.7–4.0)
MCV: 86.3 fl (ref 78.0–100.0)
Monocytes Absolute: 0.5 10*3/uL (ref 0.1–1.0)
Neutro Abs: 2.8 10*3/uL (ref 1.4–7.7)
Platelets: 196 10*3/uL (ref 150.0–400.0)
RBC: 5.12 Mil/uL — ABNORMAL HIGH (ref 3.87–5.11)
WBC: 5.2 10*3/uL (ref 4.5–10.5)

## 2011-08-24 LAB — LIPID PANEL: HDL: 42.8 mg/dL (ref >39.00)

## 2011-08-24 LAB — URINALYSIS, ROUTINE W REFLEX MICROSCOPIC
Ketones, ur: NEGATIVE
Specific Gravity, Urine: 1.015 (ref 1.000–1.030)
Urobilinogen, UA: 0.2 (ref 0.0–1.0)

## 2011-08-24 LAB — COMPREHENSIVE METABOLIC PANEL
BUN: 13 mg/dL (ref 6–23)
CO2: 31 mEq/L (ref 19–32)
Creatinine, Ser: 0.9 mg/dL (ref 0.4–1.2)
GFR: 81.46 mL/min (ref >60.00)
Glucose, Bld: 119 mg/dL — ABNORMAL HIGH (ref 70–99)
Total Bilirubin: 0.6 mg/dL (ref 0.3–1.2)

## 2011-08-24 LAB — TSH: TSH: 1.41 u[IU]/mL (ref 0.35–5.50)

## 2011-08-24 MED ORDER — NAPROXEN-ESOMEPRAZOLE 500-20 MG PO TBEC: 1.0000 | DELAYED_RELEASE_TABLET | Freq: Every day | ORAL | Status: DC

## 2011-08-24 NOTE — Progress Notes (Signed)
Subjective:    Patient ID: [name], female    DOB: [birth year], 65 y.o.   MRN: [id number]  Diabetes She presents for her follow-up diabetic visit. She has type 2 diabetes mellitus. Her disease course has been stable. There are no hypoglycemic associated symptoms. Pertinent negatives for hypoglycemia include no dizziness, headaches, pallor, seizures, speech difficulty or tremors. Pertinent negatives for diabetes include no blurred vision, no chest pain, no fatigue, no foot paresthesias, no foot ulcerations, no polydipsia, no polyphagia, no polyuria, no visual change, no weakness and no weight loss. There are no hypoglycemic complications. Symptoms are stable. There are no diabetic complications. Current diabetic treatment includes oral agent (monotherapy). She is compliant with treatment all of the time. Her weight is stable. She is following a generally healthy diet. Meal planning includes avoidance of concentrated sweets. She has not had a previous visit with a dietician. She participates in exercise intermittently. There is no change in her home blood glucose trend. Her breakfast blood glucose range is generally 110-130 mg/dl. Her lunch blood glucose range is generally 110-130 mg/dl. Her dinner blood glucose range is generally 110-130 mg/dl. Her highest blood glucose is 140-180 mg/dl. Her overall blood glucose range is 110-130 mg/dl. An ACE inhibitor/angiotensin II receptor blocker is being taken. She does not see a podiatrist.Eye exam is current.  Hyperlipidemia This is a chronic problem. The current episode started more than 1 year ago. The problem is controlled. Recent lipid tests were reviewed and are variable. Exacerbating diseases include diabetes. She has no history of chronic renal disease, hypothyroidism, liver disease, obesity or nephrotic syndrome. Pertinent negatives include no chest pain, focal sensory loss, focal weakness, leg pain, myalgias or shortness of breath. Current antihyperlipidemic  treatment includes statins. The current treatment provides moderate improvement of lipids. There are no compliance problems.       Review of Systems  Constitutional: Negative for fever, chills, weight loss, diaphoresis, activity change, appetite change, fatigue and unexpected weight change.  HENT: Negative.   Eyes: Negative.  Negative for blurred vision.  Respiratory: Negative for shortness of breath.   Cardiovascular: Negative for chest pain, palpitations and leg swelling.  Gastrointestinal: Negative for nausea, vomiting, abdominal pain, diarrhea, constipation and blood in stool.  Genitourinary: Negative for dysuria, urgency, polyuria, frequency, hematuria, flank pain, decreased urine volume, enuresis, difficulty urinating and dyspareunia.  Musculoskeletal: Positive for arthralgias (aching in shoulders and knuckles). Negative for myalgias, back pain, joint swelling and gait problem.  Skin: Negative for color change, pallor, rash and wound.  Neurological: Negative for dizziness, tremors, focal weakness, seizures, syncope, facial asymmetry, speech difficulty, weakness, light-headedness, numbness and headaches.  Hematological: Negative for polydipsia, polyphagia and adenopathy. Does not bruise/bleed easily.  Psychiatric/Behavioral: Negative.        Objective:   Physical Exam  Vitals reviewed. Constitutional: She is oriented to person, place, and time. She appears well-developed and well-nourished. No distress.  HENT:  Head: Normocephalic and atraumatic.  Mouth/Throat: Oropharynx is clear and moist. No oropharyngeal exudate.  Eyes: Conjunctivae are normal. Right eye exhibits no discharge. Left eye exhibits no discharge. No scleral icterus.  Neck: Normal range of motion. Neck supple. No JVD present. No tracheal deviation present. No thyromegaly present.  Cardiovascular: Normal rate, regular rhythm, normal heart sounds and intact distal pulses.  Exam reveals no gallop and no friction rub.     No murmur heard. Pulmonary/Chest: Effort normal and breath sounds normal. No stridor. No respiratory distress. She has no wheezes. She has no rales. She exhibits  no tenderness.  Abdominal: Soft. Bowel sounds are normal. She exhibits no distension and no mass. There is no tenderness. There is no rebound and no guarding.  Musculoskeletal: Normal range of motion. She exhibits no edema and no tenderness.  Lymphadenopathy:    She has no cervical adenopathy.  Neurological: She is oriented to person, place, and time.  Skin: Skin is warm and dry. No rash noted. She is not diaphoretic. No erythema. No pallor.  Psychiatric: She has a normal mood and affect. Her behavior is normal. Judgment and thought content normal.      Lab Results  Component Value Date   WBC 4.7 04/06/2011   HGB 14.3 04/06/2011   HCT 41.7 04/06/2011   PLT 191.0 04/06/2011   GLUCOSE 138* 05/10/2011   CHOL 155 04/06/2011   TRIG 78.0 04/06/2011   HDL 45.90 04/06/2011   LDLCALC 94 04/06/2011   ALT 31 04/06/2011   AST 26 04/06/2011   NA 141 05/10/2011   K 3.5 05/10/2011   CL 105 05/10/2011   CREATININE 0.9 05/10/2011   BUN 13 05/10/2011   CO2 31 05/10/2011   TSH 1.41 04/06/2011   INR 0.9 10/12/2007   HGBA1C 6.6* 04/06/2011      Assessment & Plan:

## 2011-08-24 NOTE — Patient Instructions (Signed)

## 2011-08-24 NOTE — Assessment & Plan Note (Signed)
Her BP is well controlled, I will check her lytes and renal function 

## 2011-08-24 NOTE — Assessment & Plan Note (Signed)
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

## 2011-08-24 NOTE — Assessment & Plan Note (Addendum)
She is due for an a1c test today, also I will monitor her renal function today

## 2011-08-24 NOTE — Assessment & Plan Note (Signed)
I will check her K+ and Mg++ levels today 

## 2011-08-24 NOTE — Assessment & Plan Note (Signed)
I will check her FLP and her CMP today

## 2011-08-25 ENCOUNTER — Encounter: Payer: Self-pay | Admitting: Internal Medicine

## 2011-08-29 ENCOUNTER — Ambulatory Visit: Payer: Medicare Other | Admitting: Internal Medicine

## 2011-09-06 ENCOUNTER — Other Ambulatory Visit: Payer: Self-pay | Admitting: Internal Medicine

## 2011-09-07 ENCOUNTER — Other Ambulatory Visit: Payer: Medicare Other

## 2011-09-07 ENCOUNTER — Encounter: Payer: Self-pay | Admitting: Internal Medicine

## 2011-09-07 ENCOUNTER — Ambulatory Visit (INDEPENDENT_AMBULATORY_CARE_PROVIDER_SITE_OTHER): Payer: Medicare Other | Admitting: Internal Medicine

## 2011-09-07 DIAGNOSIS — I10 Essential (primary) hypertension: Secondary | ICD-10-CM

## 2011-09-07 DIAGNOSIS — E876 Hypokalemia: Secondary | ICD-10-CM

## 2011-09-07 DIAGNOSIS — N390 Urinary tract infection, site not specified: Secondary | ICD-10-CM

## 2011-09-07 DIAGNOSIS — E119 Type 2 diabetes mellitus without complications: Secondary | ICD-10-CM

## 2011-09-07 MED ORDER — SITAGLIPTIN PHOSPHATE 100 MG PO TABS: 100.0000 mg | ORAL_TABLET | Freq: Every day | ORAL | Status: DC

## 2011-09-07 MED ORDER — OLMESARTAN-AMLODIPINE-HCTZ 40-10-25 MG PO TABS: 1.0000 | ORAL_TABLET | Freq: Every day | ORAL | Status: DC

## 2011-09-07 MED ORDER — CIPROFLOXACIN HCL 250 MG PO TABS: 250.0000 mg | ORAL_TABLET | Freq: Two times a day (BID) | ORAL | Status: AC

## 2011-09-07 NOTE — Progress Notes (Signed)
Subjective:    Patient ID: [name], female    DOB: [birth year], 65 y.o.   MRN: [id number]  Hypertension This is a chronic problem. The current episode started more than 1 year ago. The problem has been gradually worsening since onset. The problem is uncontrolled. Associated symptoms include headaches. Pertinent negatives include no anxiety, blurred vision, chest pain, malaise/fatigue, neck pain, orthopnea, palpitations, peripheral edema, PND, shortness of breath or sweats. There are no associated agents to hypertension. Past treatments include angiotensin blockers, calcium channel blockers and diuretics. The current treatment provides moderate improvement. Compliance problems include exercise and diet.       Review of Systems  Constitutional: Negative for fever, chills, malaise/fatigue, diaphoresis, activity change, appetite change, fatigue and unexpected weight change.  HENT: Negative for neck pain.   Eyes: Negative.  Negative for blurred vision.  Respiratory: Negative for apnea, cough, choking, chest tightness, shortness of breath, wheezing and stridor.   Cardiovascular: Negative for chest pain, palpitations, orthopnea, leg swelling and PND.  Gastrointestinal: Negative for nausea, vomiting, abdominal pain, diarrhea, constipation, abdominal distention and anal bleeding.  Genitourinary: Negative for dysuria, urgency, frequency, hematuria, flank pain, decreased urine volume, enuresis, difficulty urinating and dyspareunia.  Musculoskeletal: Negative for myalgias, back pain, joint swelling, arthralgias and gait problem.  Skin: Negative for color change, pallor, rash and wound.  Neurological: Positive for headaches. Negative for dizziness, tremors, seizures, syncope, facial asymmetry, speech difficulty, weakness and numbness.  Hematological: Negative for adenopathy. Does not bruise/bleed easily.  Psychiatric/Behavioral: Negative.        Objective:   Physical Exam  Vitals  reviewed. Constitutional: She is oriented to person, place, and time. She appears well-developed and well-nourished. No distress.  HENT:  Head: Normocephalic and atraumatic.  Mouth/Throat: No oropharyngeal exudate.  Eyes: Conjunctivae are normal. Right eye exhibits no discharge. Left eye exhibits no discharge. No scleral icterus.  Neck: Normal range of motion. Neck supple. No JVD present. No tracheal deviation present. No thyromegaly present.  Cardiovascular: Normal rate, regular rhythm, normal heart sounds and intact distal pulses.  Exam reveals no gallop and no friction rub.   No murmur heard. Pulmonary/Chest: Effort normal and breath sounds normal. No stridor. No respiratory distress. She has no wheezes. She has no rales. She exhibits no tenderness.  Abdominal: Soft. Bowel sounds are normal. She exhibits no distension and no mass. There is no tenderness. There is no rebound and no guarding.  Musculoskeletal: Normal range of motion. She exhibits no edema and no tenderness.  Lymphadenopathy:    She has no cervical adenopathy.  Neurological: She is oriented to person, place, and time.  Skin: Skin is warm and dry. No rash noted. She is not diaphoretic. No erythema. No pallor.  Psychiatric: She has a normal mood and affect. Her behavior is normal. Judgment and thought content normal.      Lab Results  Component Value Date   WBC 5.2 08/24/2011   HGB 15.1* 08/24/2011   HCT 44.2 08/24/2011   PLT 196.0 08/24/2011   GLUCOSE 119* 08/24/2011   CHOL 151 08/24/2011   TRIG 82.0 08/24/2011   HDL 42.80 08/24/2011   LDLCALC 92 08/24/2011   ALT 31 08/24/2011   AST 25 08/24/2011   NA 141 08/24/2011   K 3.6 08/24/2011   CL 104 08/24/2011   CREATININE 0.9 08/24/2011   BUN 13 08/24/2011   CO2 31 08/24/2011   TSH 1.41 08/24/2011   INR 0.9 10/12/2007   HGBA1C 6.1 08/24/2011      Assessment &  Plan:

## 2011-09-07 NOTE — Patient Instructions (Signed)
Hypertension As your heart beats, it forces blood through your arteries. This force is your blood pressure. If the pressure is too high, it is called hypertension (HTN) or high blood pressure. HTN is dangerous because you may have it and not know it. High blood pressure may mean that your heart has to work harder to pump blood. Your arteries may be narrow or stiff. The extra work puts you at risk for heart disease, stroke, and other problems.  Blood pressure consists of two numbers, a higher number over a lower, 110/72, for example. It is stated as "110 over 72." The ideal is below 120 for the top number (systolic) and under 80 for the bottom (diastolic). Write down your blood pressure today. You should pay close attention to your blood pressure if you have certain conditions such as:  Heart failure.   Prior heart attack.   Diabetes   Chronic kidney disease.   Prior stroke.   Multiple risk factors for heart disease.  To see if you have HTN, your blood pressure should be measured while you are seated with your arm held at the level of the heart. It should be measured at least twice. A one-time elevated blood pressure reading (especially in the Emergency Department) does not mean that you need treatment. There may be conditions in which the blood pressure is different between your right and left arms. It is important to see your caregiver soon for a recheck. Most people have essential hypertension which means that there is not a specific cause. This type of high blood pressure may be lowered by changing lifestyle factors such as:  Stress.   Smoking.   Lack of exercise.   Excessive weight.   Drug/tobacco/alcohol use.   Eating less salt.  Most people do not have symptoms from high blood pressure until it has caused damage to the body. Effective treatment can often prevent, delay or reduce that damage. TREATMENT  When a cause has been identified, treatment for high blood pressure is  directed at the cause. There are a large number of medications to treat HTN. These fall into several categories, and your caregiver will help you select the medicines that are best for you. Medications may have side effects. You should review side effects with your caregiver. If your blood pressure stays high after you have made lifestyle changes or started on medicines,   Your medication(s) may need to be changed.   Other problems may need to be addressed.   Be certain you understand your prescriptions, and know how and when to take your medicine.   Be sure to follow up with your caregiver within the time frame advised (usually within two weeks) to have your blood pressure rechecked and to review your medications.   If you are taking more than one medicine to lower your blood pressure, make sure you know how and at what times they should be taken. Taking two medicines at the same time can result in blood pressure that is too low.  SEEK IMMEDIATE MEDICAL CARE IF:  You develop a severe headache, blurred or changing vision, or confusion.   You have unusual weakness or numbness, or a faint feeling.   You have severe chest or abdominal pain, vomiting, or breathing problems.  MAKE SURE YOU:   Understand these instructions.   Will watch your condition.   Will get help right away if you are not doing well or get worse.  Document Released: 09/05/2005 Document Revised: 05/18/2011 Document Reviewed:   04/25/2008 Exit[employer] Patient Information 2012 [employer].Urinary Tract Infection Infections of the urinary tract can start in several places. A bladder infection (cystitis), a kidney infection (pyelonephritis), and a prostate infection (prostatitis) are different types of urinary tract infections (UTIs). They usually get better if treated with medicines (antibiotics) that kill germs. Take all the medicine until it is gone. You or your child may feel better in a few days, but TAKE ALL MEDICINE or the  infection may not respond and may become more difficult to treat. HOME CARE INSTRUCTIONS   Drink enough water and fluids to keep the urine clear or pale yellow. Cranberry juice is especially recommended, in addition to large amounts of water.   Avoid caffeine, tea, and carbonated beverages. They tend to irritate the bladder.   Alcohol may irritate the prostate.   Only take over-the-counter or prescription medicines for pain, discomfort, or fever as directed by your caregiver.  To prevent further infections:  Empty the bladder often. Avoid holding urine for long periods of time.   After a bowel movement, women should cleanse from front to back. Use each tissue only once.   Empty the bladder before and after sexual intercourse.  FINDING OUT THE RESULTS OF YOUR TEST Not all test results are available during your visit. If your or your child's test results are not back during the visit, make an appointment with your caregiver to find out the results. Do not assume everything is normal if you have not heard from your caregiver or the medical facility. It is important for you to follow up on all test results. SEEK MEDICAL CARE IF:   There is back pain.   Your baby is older than 3 months with a rectal temperature of 100.5 F (38.1 C) or higher for more than 1 day.   Your or your child's problems (symptoms) are no better in 3 days. Return sooner if you or your child is getting worse.  SEEK IMMEDIATE MEDICAL CARE IF:   There is severe back pain or lower abdominal pain.   You or your child develops chills.   You have a fever.   Your baby is older than 3 months with a rectal temperature of 102 F (38.9 C) or higher.   Your baby is 3 months old or younger with a rectal temperature of 100.4 F (38 C) or higher.   There is nausea or vomiting.   There is continued burning or discomfort with urination.  MAKE SURE YOU:   Understand these instructions.   Will watch your condition.    Will get help right away if you are not doing well or get worse.  Document Released: 06/15/2005 Document Revised: 05/18/2011 Document Reviewed: 01/18/2007 [employer] Patient Information 2012 [employer] 

## 2011-09-08 ENCOUNTER — Encounter: Payer: Self-pay | Admitting: Internal Medicine

## 2011-09-08 NOTE — Assessment & Plan Note (Signed)
This has improved.

## 2011-09-08 NOTE — Assessment & Plan Note (Signed)
Her BP is not well controlled so I raised the strength on the tribenzor

## 2011-09-08 NOTE — Assessment & Plan Note (Signed)
I will recheck her urine culture 

## 2011-09-08 NOTE — Assessment & Plan Note (Signed)
She has good BS control

## 2011-09-10 LAB — CULTURE, URINE COMPREHENSIVE: Colony Count: 10000

## 2011-09-23 ENCOUNTER — Ambulatory Visit (INDEPENDENT_AMBULATORY_CARE_PROVIDER_SITE_OTHER): Payer: Medicare Other | Admitting: Internal Medicine

## 2011-09-23 ENCOUNTER — Encounter: Payer: Self-pay | Admitting: Internal Medicine

## 2011-09-23 DIAGNOSIS — N390 Urinary tract infection, site not specified: Secondary | ICD-10-CM

## 2011-09-23 DIAGNOSIS — E876 Hypokalemia: Secondary | ICD-10-CM

## 2011-09-23 DIAGNOSIS — I10 Essential (primary) hypertension: Secondary | ICD-10-CM

## 2011-09-23 DIAGNOSIS — E119 Type 2 diabetes mellitus without complications: Secondary | ICD-10-CM

## 2011-09-23 NOTE — Patient Instructions (Signed)

## 2011-09-23 NOTE — Assessment & Plan Note (Signed)
Her BP is well controlled 

## 2011-09-23 NOTE — Progress Notes (Signed)
Subjective:    Patient ID: [name], female    DOB: [birth year], 66 y.o.   MRN: [id number]  Hypertension This is a chronic problem. The current episode started more than 1 year ago. The problem has been gradually improving since onset. The problem is controlled. Pertinent negatives include no anxiety, blurred vision, chest pain, headaches, malaise/fatigue, neck pain, orthopnea, palpitations, peripheral edema, PND, shortness of breath or sweats. There are no associated agents to hypertension. Past treatments include calcium channel blockers and diuretics. The current treatment provides significant improvement. Compliance problems include exercise and diet.   Diabetes She presents for her follow-up diabetic visit. She has type 2 diabetes mellitus. Her disease course has been stable. There are no hypoglycemic associated symptoms. Pertinent negatives for hypoglycemia include no dizziness, headaches, pallor, seizures, speech difficulty, sweats or tremors. Pertinent negatives for diabetes include no blurred vision, no chest pain, no fatigue, no foot paresthesias, no foot ulcerations, no polydipsia, no polyphagia, no polyuria, no visual change, no weakness and no weight loss. There are no hypoglycemic complications. Symptoms are stable. There are no diabetic complications. Current diabetic treatment includes oral agent (monotherapy). She is compliant with treatment all of the time. Her weight is stable. She is following a generally healthy diet. Meal planning includes avoidance of concentrated sweets. She has not had a previous visit with a dietician. She participates in exercise intermittently. There is no change in her home blood glucose trend. Her breakfast blood glucose range is generally 110-130 mg/dl. Her lunch blood glucose range is generally 90-110 mg/dl. Her dinner blood glucose range is generally 90-110 mg/dl. Her highest blood glucose is 90-110 mg/dl. Her overall blood glucose range is 90-110 mg/dl. An  ACE inhibitor/angiotensin II receptor blocker is being taken. She does not see a podiatrist.Eye exam is current.      Review of Systems  Constitutional: Negative for fever, chills, weight loss, malaise/fatigue, diaphoresis, activity change, appetite change, fatigue and unexpected weight change.  HENT: Negative for sore throat, trouble swallowing, neck pain and voice change.   Eyes: Negative.  Negative for blurred vision.  Respiratory: Negative for cough, chest tightness, shortness of breath, wheezing and stridor.   Cardiovascular: Negative for chest pain, palpitations, orthopnea, leg swelling and PND.  Gastrointestinal: Negative for nausea, vomiting, abdominal pain, diarrhea, constipation and abdominal distention.  Genitourinary: Positive for frequency. Negative for dysuria, urgency, polyuria, hematuria, flank pain, decreased urine volume, enuresis, difficulty urinating and dyspareunia.  Musculoskeletal: Negative for myalgias, back pain, joint swelling, arthralgias and gait problem.  Skin: Negative for color change, pallor, rash and wound.  Neurological: Negative for dizziness, tremors, seizures, syncope, facial asymmetry, speech difficulty, weakness, light-headedness, numbness and headaches.  Hematological: Negative for polydipsia, polyphagia and adenopathy. Does not bruise/bleed easily.  Psychiatric/Behavioral: Negative.        Objective:   Physical Exam  Vitals reviewed. Constitutional: She is oriented to person, place, and time. She appears well-developed and well-nourished. No distress.  HENT:  Head: Normocephalic and atraumatic.  Nose: Nose normal.  Mouth/Throat: Oropharynx is clear and moist. No oropharyngeal exudate.  Eyes: Conjunctivae are normal. Right eye exhibits no discharge. Left eye exhibits no discharge. No scleral icterus.  Neck: Normal range of motion. Neck supple. No JVD present. No tracheal deviation present. No thyromegaly present.  Cardiovascular: Normal rate,  regular rhythm and intact distal pulses.  Exam reveals no gallop and no friction rub.   No murmur heard. Pulmonary/Chest: Effort normal and breath sounds normal. No stridor. No respiratory distress. She has no  wheezes. She has no rales. She exhibits no tenderness.  Abdominal: Soft. Bowel sounds are normal. She exhibits no distension and no mass. There is no tenderness. There is no rebound and no guarding.  Musculoskeletal: Normal range of motion. She exhibits no edema and no tenderness.  Lymphadenopathy:    She has no cervical adenopathy.  Neurological: She is oriented to person, place, and time.  Skin: Skin is warm and dry. No rash noted. She is not diaphoretic. No erythema. No pallor.  Psychiatric: She has a normal mood and affect. Her behavior is normal. Judgment and thought content normal.     Lab Results  Component Value Date   WBC 5.2 08/24/2011   HGB 15.1* 08/24/2011   HCT 44.2 08/24/2011   PLT 196.0 08/24/2011   GLUCOSE 119* 08/24/2011   CHOL 151 08/24/2011   TRIG 82.0 08/24/2011   HDL 42.80 08/24/2011   LDLCALC 92 08/24/2011   ALT 31 08/24/2011   AST 25 08/24/2011   NA 141 08/24/2011   K 3.6 08/24/2011   CL 104 08/24/2011   CREATININE 0.9 08/24/2011   BUN 13 08/24/2011   CO2 31 08/24/2011   TSH 1.41 08/24/2011   INR 0.9 10/12/2007   HGBA1C 6.1 08/24/2011       Assessment & Plan:

## 2011-09-23 NOTE — Assessment & Plan Note (Signed)
The last clx showed only 10k colonies of E coli so will hold off on antibiotics for now

## 2011-09-23 NOTE — Assessment & Plan Note (Signed)
She is doing well on K+ replacement

## 2011-09-23 NOTE — Assessment & Plan Note (Signed)
Her blood sugars are well controlled 

## 2011-10-11 ENCOUNTER — Other Ambulatory Visit: Payer: Self-pay | Admitting: Internal Medicine

## 2011-10-11 DIAGNOSIS — Z1231 Encounter for screening mammogram for malignant neoplasm of breast: Secondary | ICD-10-CM

## 2011-11-17 ENCOUNTER — Ambulatory Visit
Admission: RE | Admit: 2011-11-17 | Discharge: 2011-11-17 | Disposition: A | Discharge status: Home / Self Care | Payer: Medicare Other | Source: Ambulatory Visit | Attending: Internal Medicine | Admitting: Internal Medicine

## 2011-11-17 DIAGNOSIS — Z1231 Encounter for screening mammogram for malignant neoplasm of breast: Secondary | ICD-10-CM

## 2011-11-18 LAB — HM MAMMOGRAPHY: HM Mammogram: NORMAL

## 2011-11-25 ENCOUNTER — Encounter: Payer: Self-pay | Admitting: Internal Medicine

## 2011-11-25 ENCOUNTER — Ambulatory Visit (INDEPENDENT_AMBULATORY_CARE_PROVIDER_SITE_OTHER): Payer: Medicare Other | Admitting: Internal Medicine

## 2011-11-25 VITALS — BP 160/80 | HR 64 | Temp 98.8°F | Ht 62.0 in | Wt 176.0 lb

## 2011-11-25 DIAGNOSIS — I10 Essential (primary) hypertension: Secondary | ICD-10-CM

## 2011-11-25 DIAGNOSIS — R609 Edema, unspecified: Secondary | ICD-10-CM

## 2011-11-25 MED ORDER — OLMESARTAN MEDOXOMIL-HCTZ 40-25 MG PO TABS: 1.0000 | ORAL_TABLET | Freq: Every day | ORAL | Status: DC

## 2011-11-25 MED ORDER — CLONIDINE HCL 0.1 MG PO TABS: 0.1000 mg | ORAL_TABLET | Freq: Two times a day (BID) | ORAL | Status: DC

## 2011-11-25 MED ORDER — FUROSEMIDE 20 MG PO TABS: 20.0000 mg | ORAL_TABLET | Freq: Every day | ORAL | Status: DC

## 2011-11-25 NOTE — Patient Instructions (Signed)
It was good to see you today. Your swelling is likely related to amlodipine dose - stop Tribenzor today because of this Use Benicar HCT 40/25 once daily - samples x3 weeks and refill since your pharmacy Begin clonidine 0.1 mg twice daily for blood pressure control in place of amlodipine Continue carvedilol as currently prescribed Also use low-dose Lasix each a.m. x1 week, then as needed to help fluid removal Your prescription(s) have been submitted to your pharmacy. Please take as directed and contact our office if you believe you are having problem(s) with the medication(s). Please schedule followup in 3-4 weeks with Dr Yetta Barre to recheck blood pressure/medications, call sooner if problems.

## 2011-11-25 NOTE — Assessment & Plan Note (Signed)
Uncontrolled -  Edema likely due to increase amlodipine dose Stop amlodipine - change "tribenzor 40/10/25" to benicar hct 40/25 - 3 week samples done today + new erx Add low dose lasix daily x 1 week to help extra fluid volume (up 5#) Unable to up titrate coreg due to HR Add clonidine - new erx done follow up 2-4 weeks with PCP to review BP and titrate meds as needed

## 2011-11-25 NOTE — Progress Notes (Signed)
  Subjective:    Patient ID: [name], female    DOB: 1945-12-10, 66 y.o.   MRN: [id number]  HPI complains of swelling feet ankles Progressive over past few weeks Precipitated by increase in amlodipine dose for BP med titration  Past Medical History  Diagnosis Date  . Hyperlipidemia   . Hypertension   . Osteoarthritis   . Diabetes mellitus     type 2    Review of Systems  Constitutional: Negative for fever and fatigue.  Respiratory: Negative for cough and shortness of breath.   Cardiovascular: Positive for leg swelling. Negative for chest pain and palpitations.       Objective:   Physical Exam BP 160/80  Pulse 64  Temp(Src) 98.8 F (37.1 C) (Oral)  Ht 5\' 2"  (1.575 m)  Wt 176 lb (79.833 kg)  BMI 32.19 kg/m2  SpO2 97% Wt Readings from Last 3 Encounters:  11/25/11 176 lb (79.833 kg)  09/23/11 173 lb 4 oz (78.586 kg)  09/07/11 171 lb (77.565 kg)   Constitutional: She appears well-developed and well-nourished. No distress.  Neck: Normal range of motion. Neck supple. No JVD present. No thyromegaly present.  Cardiovascular: Normal rate, regular rhythm and normal heart sounds.  No murmur heard. 1+ pitting BLE edema feet to mid shin. Pulmonary/Chest: Effort normal and breath sounds normal. No respiratory distress. She has no wheezes.  Psychiatric: She has a normal mood and affect. Her behavior is normal. Judgment and thought content normal.   Lab Results  Component Value Date   WBC 5.2 08/24/2011   HGB 15.1* 08/24/2011   HCT 44.2 08/24/2011   PLT 196.0 08/24/2011   GLUCOSE 119* 08/24/2011   CHOL 151 08/24/2011   TRIG 82.0 08/24/2011   HDL 42.80 08/24/2011   LDLCALC 92 08/24/2011   ALT 31 08/24/2011   AST 25 08/24/2011   NA 141 08/24/2011   K 3.6 08/24/2011   CL 104 08/24/2011   CREATININE 0.9 08/24/2011   BUN 13 08/24/2011   CO2 31 08/24/2011   TSH 1.41 08/24/2011   INR 0.9 10/12/2007   HGBA1C 6.1 08/24/2011       Assessment & Plan:

## 2011-12-15 ENCOUNTER — Encounter: Payer: Self-pay | Admitting: Internal Medicine

## 2011-12-15 ENCOUNTER — Other Ambulatory Visit (INDEPENDENT_AMBULATORY_CARE_PROVIDER_SITE_OTHER): Payer: Medicare Other

## 2011-12-15 ENCOUNTER — Ambulatory Visit (INDEPENDENT_AMBULATORY_CARE_PROVIDER_SITE_OTHER): Payer: Medicare Other | Admitting: Internal Medicine

## 2011-12-15 VITALS — BP 130/76 | HR 63 | Temp 98.6°F | Resp 16 | Wt 173.0 lb

## 2011-12-15 DIAGNOSIS — E119 Type 2 diabetes mellitus without complications: Secondary | ICD-10-CM

## 2011-12-15 DIAGNOSIS — I10 Essential (primary) hypertension: Secondary | ICD-10-CM

## 2011-12-15 DIAGNOSIS — E785 Hyperlipidemia, unspecified: Secondary | ICD-10-CM

## 2011-12-15 DIAGNOSIS — M1990 Unspecified osteoarthritis, unspecified site: Secondary | ICD-10-CM

## 2011-12-15 LAB — COMPREHENSIVE METABOLIC PANEL
AST: 22 U/L (ref 0–37)
Albumin: 3.8 g/dL (ref 3.5–5.2)
Alkaline Phosphatase: 53 U/L (ref 39–117)
Potassium: 3.4 mEq/L — ABNORMAL LOW (ref 3.5–5.1)
Sodium: 141 mEq/L (ref 135–145)
Total Protein: 6.9 g/dL (ref 6.0–8.3)

## 2011-12-15 LAB — CBC WITH DIFFERENTIAL/PLATELET
Basophils Absolute: 0 10*3/uL (ref 0.0–0.1)
Eosinophils Relative: 1.9 % (ref 0.0–5.0)
HCT: 42.5 % (ref 36.0–46.0)
Lymphocytes Relative: 37.9 % (ref 12.0–46.0)
Lymphs Abs: 1.9 10*3/uL (ref 0.7–4.0)
Monocytes Relative: 9.8 % (ref 3.0–12.0)
Platelets: 173 10*3/uL (ref 150.0–400.0)
WBC: 5.1 10*3/uL (ref 4.5–10.5)

## 2011-12-15 LAB — HEMOGLOBIN A1C: Hgb A1c MFr Bld: 6.2 % (ref 4.6–6.5)

## 2011-12-15 LAB — CK: Total CK: 108 U/L (ref 7–177)

## 2011-12-15 MED ORDER — NAPROXEN-ESOMEPRAZOLE 500-20 MG PO TBEC: 1.0000 | DELAYED_RELEASE_TABLET | Freq: Every day | ORAL | Status: DC

## 2011-12-15 NOTE — Patient Instructions (Signed)

## 2011-12-15 NOTE — Assessment & Plan Note (Signed)
She will continue vimovo as needed

## 2011-12-15 NOTE — Progress Notes (Signed)
Subjective:    Patient ID: [name], female    DOB: 09/30/1945, 66 y.o.   MRN: [id number]  Diabetes She presents for her follow-up diabetic visit. She has type 2 diabetes mellitus. Her disease course has been stable. There are no hypoglycemic associated symptoms. Pertinent negatives for hypoglycemia include no dizziness, headaches, seizures, speech difficulty or tremors. Pertinent negatives for diabetes include no blurred vision, no chest pain, no fatigue, no foot paresthesias, no foot ulcerations, no polydipsia, no polyphagia, no polyuria, no visual change, no weakness and no weight loss. There are no hypoglycemic complications. Symptoms are stable. There are no diabetic complications. Current diabetic treatment includes diet. She is compliant with treatment some of the time. Her weight is stable. She is following a generally healthy diet. Meal planning includes avoidance of concentrated sweets. She participates in exercise intermittently. There is no change in her home blood glucose trend. An ACE inhibitor/angiotensin II receptor blocker is being taken. She does not see a podiatrist.Eye exam is current.      Review of Systems  Constitutional: Negative for fever, chills, weight loss, diaphoresis, activity change, appetite change, fatigue and unexpected weight change.  HENT: Negative.   Eyes: Negative.  Negative for blurred vision.  Respiratory: Negative for apnea, cough, chest tightness, shortness of breath, wheezing and stridor.   Cardiovascular: Negative for chest pain, palpitations and leg swelling.  Genitourinary: Negative.  Negative for polyuria.  Musculoskeletal: Positive for arthralgias (in her MCP joints bilaterally). Negative for myalgias, back pain, joint swelling and gait problem.  Neurological: Negative for dizziness, tremors, seizures, syncope, facial asymmetry, speech difficulty, weakness, light-headedness, numbness and headaches.  Hematological: Negative for polydipsia,  polyphagia and adenopathy. Does not bruise/bleed easily.  Psychiatric/Behavioral: Negative.        Objective:   Physical Exam  Vitals reviewed. Constitutional: She is oriented to person, place, and time. She appears well-developed and well-nourished. No distress.  HENT:  Head: Normocephalic and atraumatic.  Mouth/Throat: Oropharynx is clear and moist. No oropharyngeal exudate.  Eyes: Conjunctivae are normal. Right eye exhibits no discharge. Left eye exhibits no discharge. No scleral icterus.  Neck: Normal range of motion. Neck supple. No JVD present. No tracheal deviation present. No thyromegaly present.  Cardiovascular: Normal rate, regular rhythm, normal heart sounds and intact distal pulses.  Exam reveals no gallop and no friction rub.   No murmur heard. Pulmonary/Chest: Effort normal and breath sounds normal. No stridor. No respiratory distress. She has no wheezes. She has no rales. She exhibits no tenderness.  Abdominal: Soft. Bowel sounds are normal. She exhibits no distension and no mass. There is no tenderness. There is no rebound and no guarding.  Musculoskeletal: Normal range of motion. She exhibits no edema and no tenderness.  Lymphadenopathy:    She has no cervical adenopathy.  Neurological: She is oriented to person, place, and time.  Skin: Skin is warm and dry. No rash noted. She is not diaphoretic. No erythema. No pallor.  Psychiatric: She has a normal mood and affect. Her behavior is normal. Judgment and thought content normal.      Lab Results  Component Value Date   WBC 5.2 08/24/2011   HGB 15.1* 08/24/2011   HCT 44.2 08/24/2011   PLT 196.0 08/24/2011   GLUCOSE 119* 08/24/2011   CHOL 151 08/24/2011   TRIG 82.0 08/24/2011   HDL 42.80 08/24/2011   LDLCALC 92 08/24/2011   ALT 31 08/24/2011   AST 25 08/24/2011   NA 141 08/24/2011   K 3.6 08/24/2011  CL 104 08/24/2011   CREATININE 0.9 08/24/2011   BUN 13 08/24/2011   CO2 31 08/24/2011   TSH 1.41 08/24/2011   INR 0.9  10/12/2007   HGBA1C 6.1 08/24/2011      Assessment & Plan:

## 2011-12-15 NOTE — Assessment & Plan Note (Signed)
She has decided to stop all meds for now, I will check her a1c today and will monitor her renal function

## 2011-12-15 NOTE — Assessment & Plan Note (Signed)
Her BP is well controlled, I will check her lytes and renal function today 

## 2011-12-15 NOTE — Assessment & Plan Note (Signed)
She is doing well on crestor 

## 2012-01-04 ENCOUNTER — Other Ambulatory Visit: Payer: Self-pay | Admitting: Internal Medicine

## 2012-01-16 ENCOUNTER — Ambulatory Visit (INDEPENDENT_AMBULATORY_CARE_PROVIDER_SITE_OTHER): Payer: Medicare Other | Admitting: Internal Medicine

## 2012-01-16 ENCOUNTER — Encounter: Payer: Self-pay | Admitting: Internal Medicine

## 2012-01-16 VITALS — BP 142/76 | HR 65 | Temp 97.2°F | Resp 16

## 2012-01-16 DIAGNOSIS — E119 Type 2 diabetes mellitus without complications: Secondary | ICD-10-CM

## 2012-01-16 MED ORDER — SAXAGLIPTIN HCL 2.5 MG PO TABS: 2.5000 mg | ORAL_TABLET | Freq: Every day | ORAL | Status: DC

## 2012-01-16 NOTE — Assessment & Plan Note (Signed)
Start onglyza

## 2012-01-16 NOTE — Progress Notes (Signed)
Subjective:    Patient ID: [name], female    DOB: 08/28/1946, 66 y.o.   MRN: [id number]  Diabetes She presents for her follow-up diabetic visit. She has type 2 diabetes mellitus. Her disease course has been fluctuating. There are no hypoglycemic associated symptoms. Pertinent negatives for diabetes include no blurred vision, no chest pain, no fatigue, no foot paresthesias, no foot ulcerations, no polydipsia, no polyphagia, no polyuria, no visual change, no weakness and no weight loss. There are no hypoglycemic complications. Symptoms are stable. There are no diabetic complications. When asked about current treatments, none were reported. Her weight is stable. She is following a diabetic diet. Meal planning includes avoidance of concentrated sweets. She has not had a previous visit with a dietician. She participates in exercise intermittently. Her home blood glucose trend is increasing steadily. Her breakfast blood glucose range is generally 130-140 mg/dl. Her lunch blood glucose range is generally 130-140 mg/dl. Her dinner blood glucose range is generally 130-140 mg/dl. Her highest blood glucose is 140-180 mg/dl. Her overall blood glucose range is 130-140 mg/dl. An ACE inhibitor/angiotensin II receptor blocker is being taken. She does not see a podiatrist.Eye exam is current.      Review of Systems  Constitutional: Negative.  Negative for weight loss and fatigue.  HENT: Negative.   Eyes: Negative.  Negative for blurred vision.  Respiratory: Negative.   Cardiovascular: Negative.  Negative for chest pain.  Gastrointestinal: Negative.   Genitourinary: Negative.  Negative for polyuria.  Musculoskeletal: Negative.   Skin: Negative.   Neurological: Negative.  Negative for weakness.  Hematological: Negative.  Negative for polydipsia and polyphagia.  Psychiatric/Behavioral: Negative.        Objective:   Physical Exam  Vitals reviewed. Constitutional: She is oriented to person, place, and  time. She appears well-developed and well-nourished. No distress.  HENT:  Head: Normocephalic and atraumatic.  Mouth/Throat: Oropharynx is clear and moist. No oropharyngeal exudate.  Eyes: Conjunctivae are normal. Right eye exhibits no discharge. Left eye exhibits no discharge. No scleral icterus.  Neck: Normal range of motion. Neck supple. No JVD present. No tracheal deviation present. No thyromegaly present.  Cardiovascular: Normal rate, regular rhythm, normal heart sounds and intact distal pulses.  Exam reveals no gallop and no friction rub.   No murmur heard. Pulmonary/Chest: Effort normal and breath sounds normal. No stridor. No respiratory distress. She has no wheezes. She has no rales. She exhibits no tenderness.  Abdominal: Soft. Bowel sounds are normal. She exhibits no distension and no mass. There is no tenderness. There is no rebound and no guarding.  Musculoskeletal: Normal range of motion. She exhibits no edema and no tenderness.  Lymphadenopathy:    She has no cervical adenopathy.  Neurological: She is oriented to person, place, and time.  Skin: Skin is warm and dry. No rash noted. She is not diaphoretic. No erythema. No pallor.  Psychiatric: She has a normal mood and affect. Her behavior is normal. Judgment and thought content normal.      Lab Results  Component Value Date   WBC 5.1 12/15/2011   HGB 14.4 12/15/2011   HCT 42.5 12/15/2011   PLT 173.0 12/15/2011   GLUCOSE 89 12/15/2011   CHOL 151 08/24/2011   TRIG 82.0 08/24/2011   HDL 42.80 08/24/2011   LDLCALC 92 08/24/2011   ALT 31 12/15/2011   AST 22 12/15/2011   NA 141 12/15/2011   K 3.4* 12/15/2011   CL 103 12/15/2011   CREATININE 0.9 12/15/2011  BUN 15 12/15/2011   CO2 31 12/15/2011   TSH 1.41 08/24/2011   INR 0.9 10/12/2007   HGBA1C 6.2 12/15/2011      Assessment & Plan:

## 2012-01-16 NOTE — Patient Instructions (Signed)

## 2012-02-14 ENCOUNTER — Other Ambulatory Visit: Payer: Self-pay | Admitting: Internal Medicine

## 2012-02-15 ENCOUNTER — Encounter: Payer: Self-pay | Admitting: Internal Medicine

## 2012-02-15 ENCOUNTER — Other Ambulatory Visit (INDEPENDENT_AMBULATORY_CARE_PROVIDER_SITE_OTHER): Payer: Medicare Other

## 2012-02-15 ENCOUNTER — Ambulatory Visit (INDEPENDENT_AMBULATORY_CARE_PROVIDER_SITE_OTHER): Payer: Medicare Other | Admitting: Internal Medicine

## 2012-02-15 VITALS — BP 164/70 | HR 64 | Temp 98.7°F | Resp 20 | Wt 168.0 lb

## 2012-02-15 DIAGNOSIS — E876 Hypokalemia: Secondary | ICD-10-CM

## 2012-02-15 DIAGNOSIS — I10 Essential (primary) hypertension: Secondary | ICD-10-CM

## 2012-02-15 LAB — BASIC METABOLIC PANEL
BUN: 14 mg/dL (ref 6–23)
Calcium: 10.3 mg/dL (ref 8.4–10.5)
GFR: 86.96 mL/min (ref >60.00)
Glucose, Bld: 79 mg/dL (ref 70–99)
Potassium: 4 mEq/L (ref 3.5–5.1)

## 2012-02-15 MED ORDER — CLONIDINE HCL 0.3 MG PO TABS: 0.3000 mg | ORAL_TABLET | Freq: Two times a day (BID) | ORAL | Status: DC

## 2012-02-15 NOTE — Assessment & Plan Note (Signed)
I will recheck her K+ level and will check her Mg++ level as well to see if that needs to be replaced as well

## 2012-02-15 NOTE — Patient Instructions (Signed)

## 2012-02-15 NOTE — Assessment & Plan Note (Signed)
Her BP is not well controlled so I have increased the dose of clonidine, I will check her lytes and renal function today

## 2012-02-15 NOTE — Progress Notes (Signed)
  Subjective:    Patient ID: [name], female    DOB: 03-07-1946, 66 y.o.   MRN: [id number]  Hypertension This is a chronic problem. The current episode started more than 1 year ago. The problem has been gradually worsening since onset. The problem is uncontrolled. Pertinent negatives include no anxiety, blurred vision, chest pain, headaches, malaise/fatigue, neck pain, orthopnea, palpitations, peripheral edema, PND, shortness of breath or sweats. There are no associated agents to hypertension. Past treatments include central alpha agonists, diuretics, beta blockers and angiotensin blockers. The current treatment provides moderate improvement. There are no compliance problems.       Review of Systems  Constitutional: Negative for fever, chills, malaise/fatigue, diaphoresis, activity change, appetite change, fatigue and unexpected weight change.  HENT: Negative.  Negative for neck pain.   Eyes: Negative.  Negative for blurred vision.  Respiratory: Negative for cough, chest tightness, shortness of breath, wheezing and stridor.   Cardiovascular: Negative for chest pain, palpitations, orthopnea, leg swelling and PND.  Gastrointestinal: Negative for nausea, vomiting, abdominal pain, diarrhea, constipation and anal bleeding.  Genitourinary: Negative.   Musculoskeletal: Negative for myalgias, back pain, joint swelling, arthralgias and gait problem.  Skin: Negative for color change, pallor, rash and wound.  Neurological: Negative for dizziness, tremors, seizures, syncope, facial asymmetry, speech difficulty, weakness, light-headedness, numbness and headaches.  Hematological: Negative for adenopathy. Does not bruise/bleed easily.  Psychiatric/Behavioral: Negative.        Objective:   Physical Exam  Vitals reviewed. Constitutional: She is oriented to person, place, and time. She appears well-developed and well-nourished. No distress.  HENT:  Head: Normocephalic and atraumatic.  Mouth/Throat:  Oropharynx is clear and moist. No oropharyngeal exudate.  Eyes: Conjunctivae are normal. Right eye exhibits no discharge. Left eye exhibits no discharge. No scleral icterus.  Neck: Normal range of motion. Neck supple. No JVD present. No tracheal deviation present. No thyromegaly present.  Cardiovascular: Normal rate, regular rhythm, normal heart sounds and intact distal pulses.  Exam reveals no gallop and no friction rub.   No murmur heard. Pulmonary/Chest: Effort normal and breath sounds normal. No stridor. No respiratory distress. She has no wheezes. She has no rales. She exhibits no tenderness.  Abdominal: Soft. Bowel sounds are normal. She exhibits no distension. There is no tenderness. There is no rebound and no guarding.  Musculoskeletal: Normal range of motion. She exhibits no edema and no tenderness.  Lymphadenopathy:    She has no cervical adenopathy.  Neurological: She is oriented to person, place, and time.  Skin: Skin is warm and dry. No rash noted. She is not diaphoretic. No erythema. No pallor.  Psychiatric: She has a normal mood and affect. Her behavior is normal. Judgment and thought content normal.     Lab Results  Component Value Date   WBC 5.1 12/15/2011   HGB 14.4 12/15/2011   HCT 42.5 12/15/2011   PLT 173.0 12/15/2011   GLUCOSE 89 12/15/2011   CHOL 151 08/24/2011   TRIG 82.0 08/24/2011   HDL 42.80 08/24/2011   LDLCALC 92 08/24/2011   ALT 31 12/15/2011   AST 22 12/15/2011   NA 141 12/15/2011   K 3.4* 12/15/2011   CL 103 12/15/2011   CREATININE 0.9 12/15/2011   BUN 15 12/15/2011   CO2 31 12/15/2011   TSH 1.41 08/24/2011   INR 0.9 10/12/2007   HGBA1C 6.2 12/15/2011       Assessment & Plan:

## 2012-02-26 ENCOUNTER — Encounter (HOSPITAL_COMMUNITY): Payer: Self-pay | Admitting: *Deleted

## 2012-02-26 ENCOUNTER — Other Ambulatory Visit: Payer: Self-pay

## 2012-02-26 ENCOUNTER — Emergency Department (HOSPITAL_COMMUNITY)
Admission: EM | Admit: 2012-02-26 | Discharge: 2012-02-27 | Disposition: A | Discharge status: Home / Self Care | Payer: Medicare Other | Attending: Emergency Medicine | Admitting: Emergency Medicine

## 2012-02-26 DIAGNOSIS — N390 Urinary tract infection, site not specified: Secondary | ICD-10-CM | POA: Insufficient documentation

## 2012-02-26 DIAGNOSIS — Z79899 Other long term (current) drug therapy: Secondary | ICD-10-CM | POA: Insufficient documentation

## 2012-02-26 DIAGNOSIS — E119 Type 2 diabetes mellitus without complications: Secondary | ICD-10-CM | POA: Insufficient documentation

## 2012-02-26 DIAGNOSIS — E785 Hyperlipidemia, unspecified: Secondary | ICD-10-CM | POA: Insufficient documentation

## 2012-02-26 DIAGNOSIS — M1990 Unspecified osteoarthritis, unspecified site: Secondary | ICD-10-CM | POA: Insufficient documentation

## 2012-02-26 DIAGNOSIS — I10 Essential (primary) hypertension: Secondary | ICD-10-CM

## 2012-02-26 DIAGNOSIS — Z87891 Personal history of nicotine dependence: Secondary | ICD-10-CM | POA: Insufficient documentation

## 2012-02-26 NOTE — ED Notes (Signed)
Patient present to ED with complain of high blood pressure.  Patient's BP at home was 202/102 and she called her online nurse and was told to come to ED.   Patient denies any headache.

## 2012-02-26 NOTE — ED Provider Notes (Addendum)
History     CSN: [id number]  Arrival date & time 02/26/12  2151   First MD Initiated Contact with Patient 02/26/12 2323      Chief Complaint  Patient presents with  . Hypertension    (Consider location/radiation/quality/duration/timing/severity/associated sxs/prior treatment) HPI Comments: Patient with a history of hypertension.  Has been on several different medications per Dr. Sanda Linger.  Recently had clonidine 0.1 mg added to her vacation.  Machine.  This was not effective, and she saw her physician on Tuesday, and he increase the clonidine to point, 3.  She's been taking this ever since, but has had a dull, frontal headache since that, intermittent.  She took her blood pressure.  Tonight.  It was elevated, over 200.  She called the help line and was instructed to come to the emergency department for further evaluation.  Since arrival her blood pressure has come down to 179/79.  She no longer has a headache.  She denies any nausea, vomiting, blurry vision, ataxia, focal weakness  Patient is a 66 y.o. female presenting with hypertension. The history is provided by the patient.  Hypertension This is a new problem. The current episode started more than 1 month ago. The problem has been gradually improving. Pertinent negatives include no chest pain, chills, fever, headaches or weakness.    Past Medical History  Diagnosis Date  . Hyperlipidemia   . Hypertension   . Osteoarthritis   . Diabetes mellitus     type 2    Past Surgical History  Procedure Date  . Tubal ligation     Family History  Problem Relation Age of Onset  . Hypertension Other     History  Substance Use Topics  . Smoking status: Former Games developer  . Smokeless tobacco: Not on file  . Alcohol Use: No    OB History    Grav Para Term Preterm Abortions TAB SAB Ect Mult Living                  Review of Systems  Constitutional: Negative for fever and chills.  HENT: Negative for rhinorrhea.   Eyes: Negative  for visual disturbance.  Cardiovascular: Negative for chest pain and leg swelling.  Musculoskeletal: Negative for gait problem.  Neurological: Negative for dizziness, speech difficulty, weakness and headaches.    Allergies  Amlodipine; Lisinopril; Metformin; and Pioglitazone  Home Medications   Current Outpatient Rx  Name Route Sig Dispense Refill  . CARVEDILOL 12.5 MG PO TABS Oral Take 2 tablets (25 mg total) by mouth 2 (two) times daily with a meal. 120 tablet 4  . CLONIDINE HCL 0.3 MG PO TABS Oral Take 0.3 mg by mouth 2 (two) times daily.    Marland Kitchen LIRAGLUTIDE 18 MG/3ML [place] SOLN Subcutaneous Inject 1.2 mLs into the skin daily.    Marland Kitchen OLMESARTAN MEDOXOMIL-HCTZ 40-25 MG PO TABS Oral Take 1 tablet by mouth daily.    Marland Kitchen POTASSIUM CHLORIDE CRYS ER 15 MEQ PO TBCR Oral Take 15 mEq by mouth 2 (two) times daily.    Marland Kitchen ROSUVASTATIN CALCIUM 20 MG PO TABS Oral Take 40 mg by mouth daily.     . CEPHALEXIN 500 MG PO CAPS Oral Take 1 capsule (500 mg total) by mouth 3 (three) times daily. 30 capsule 0  . SAXAGLIPTIN HCL 2.5 MG PO TABS Oral Take 2.5 mg by mouth daily.      BP 131/70  Pulse 63  Temp(Src) 98.5 F (36.9 C) (Oral)  Resp 18  SpO2 100%  Physical Exam  Constitutional: She is oriented to person, place, and time. She appears well-developed and well-nourished.  HENT:  Head: Normocephalic.  Eyes: Pupils are equal, round, and reactive to light.  Neck: Normal range of motion.  Cardiovascular: Normal rate.   Pulmonary/Chest: Effort normal.  Musculoskeletal: Normal range of motion.  Neurological: She is alert and oriented to person, place, and time. No cranial nerve deficit. Coordination normal.  Skin: Skin is warm.    ED Course  Procedures (including critical care time)  Labs Reviewed  URINALYSIS, ROUTINE W REFLEX MICROSCOPIC - Abnormal; Notable for the following:    APPearance CLOUDY (*)    Hgb urine dipstick TRACE (*)    Leukocytes, UA LARGE (*)    All other components within normal  limits  POCT I-STAT, CHEM 8 - Abnormal; Notable for the following:    Potassium 3.2 (*)    Glucose, Bld 160 (*)    Calcium, Ion 1.33 (*)    All other components within normal limits  URINE MICROSCOPIC-ADD ON   No results found.   1. Hypertension   2. UTI (lower urinary tract infection)    ED ECG REPORT   Date: 02/27/2012  EKG Time: 1:56 AM  Rate: 58  Rhythm: sinus bradycardia,  normal EKG, normal sinus rhythm, unchanged from previous tracings  Axis: normal  Intervals:none  ST&T Change: none  Narrative Interpretation: sinus bradycardia             MDM   We'll obtain electrolytes, EKG, urine, and continue to observe patient.  Hopefully, that her blood pressure medication, that she took approximately 2 hours ago.  Continue to lower her blood pressure at these are all normal.  I will follow up with her PCP in the morning        Arman Filter, NP 02/27/12 0108  Arman Filter, NP 02/27/12 0157

## 2012-02-27 ENCOUNTER — Telehealth: Payer: Self-pay

## 2012-02-27 LAB — URINALYSIS, ROUTINE W REFLEX MICROSCOPIC
Glucose, UA: NEGATIVE mg/dL
Nitrite: NEGATIVE
Protein, ur: NEGATIVE mg/dL
Urobilinogen, UA: 0.2 mg/dL (ref 0.0–1.0)

## 2012-02-27 LAB — URINE MICROSCOPIC-ADD ON

## 2012-02-27 LAB — POCT I-STAT, CHEM 8
BUN: 15 mg/dL (ref 6–23)
Calcium, Ion: 1.33 mmol/L — ABNORMAL HIGH (ref 1.12–1.32)
HCT: 44 % (ref 36.0–46.0)
Hemoglobin: 15 g/dL (ref 12.0–15.0)
Sodium: 142 mEq/L (ref 135–145)
TCO2: 26 mmol/L (ref 0–100)

## 2012-02-27 MED ORDER — CEPHALEXIN 500 MG PO CAPS: 500.0000 mg | ORAL_CAPSULE | Freq: Three times a day (TID) | ORAL | Status: AC

## 2012-02-27 MED ORDER — CEPHALEXIN 250 MG PO CAPS
500.0000 mg | ORAL_CAPSULE | Freq: Once | ORAL | Status: AC
  Administered 2012-02-27: 500 mg via ORAL
  Filled 2012-02-27: qty 2

## 2012-02-27 NOTE — Discharge Instructions (Signed)
Hypertension Information As your heart beats, it forces blood through your arteries. This force is your blood pressure. If the pressure is too high, it is called hypertension (HTN) or high blood pressure. HTN is dangerous because you may have it and not know it. High blood pressure may mean that your heart has to work harder to pump blood. Your arteries may be narrow or stiff. The extra work puts you at risk for heart disease, stroke, and other problems.  Blood pressure consists of two numbers, a higher number over a lower, 110/72, for example. It is stated as "110 over 72." The ideal is below 120 for the top number (systolic) and under 80 for the bottom (diastolic).  You should pay close attention to your blood pressure if you have certain conditions such as:  Heart failure.   Prior heart attack.   Diabetes   Chronic kidney disease.   Prior stroke.   Multiple risk factors for heart disease.  To see if you have HTN, your blood pressure should be measured while you are seated with your arm held at the level of the heart. It should be measured at least twice. A one-time elevated blood pressure reading (especially in the Emergency Department) does not mean that you need treatment. There may be conditions in which the blood pressure is different between your right and left arms. It is important to see your caregiver soon for a recheck. Most people have essential hypertension which means that there is not a specific cause. This type of high blood pressure may be lowered by changing lifestyle factors such as:  Stress.   Smoking.   Lack of exercise.   Excessive weight.   Drug/tobacco/alcohol use.   Eating less salt.  Most people do not have symptoms from high blood pressure until it has caused damage to the body. Effective treatment can often prevent, delay or reduce that damage. TREATMENT  Treatment for high blood pressure, when a cause has been identified, is directed at the cause. There  are a large number of medications to treat HTN. These fall into several categories, and your caregiver will help you select the medicines that are best for you. Medications may have side effects. You should review side effects with your caregiver. If your blood pressure stays high after you have made lifestyle changes or started on medicines,   Your medication(s) may need to be changed.   Other problems may need to be addressed.   Be certain you understand your prescriptions, and know how and when to take your medicine.   Be sure to follow up with your caregiver within the time frame advised (usually within two weeks) to have your blood pressure rechecked and to review your medications.   If you are taking more than one medicine to lower your blood pressure, make sure you know how and at what times they should be taken. Taking two medicines at the same time can result in blood pressure that is too low.  Document Released: 11/08/2005 Document Revised: 05/18/2011 Document Reviewed: 11/15/2007 [facility] Patient Information 2012 [place], [place]. Lethargic, came down nicely just with the use of your medication, you do have a urinary tract infection, for, which you're being treated with Keflex.  Please call Dr. Yetta Barre in the morning to review your medications and possibly change from the clonidine to a different prescription antihypertensive

## 2012-02-27 NOTE — ED Provider Notes (Signed)
Medical screening examination/treatment/procedure(s) were performed by non-physician practitioner and as supervising physician I was immediately available for consultation/collaboration.  Mahagony Grieb K Giavanna Kang-Rasch, MD 02/27/12 0149 

## 2012-02-27 NOTE — ED Provider Notes (Signed)
Medical screening examination/treatment/procedure(s) were performed by non-physician practitioner and as supervising physician I was immediately available for consultation/collaboration.  Jasmine Awe, MD 02/27/12 [phone]

## 2012-02-27 NOTE — Telephone Encounter (Signed)
[facility] Triage Call Report Triage Record Num: [id number] Operator: Audelia Hives Patient Name: [name] Call Date & Time: 02/26/2012 8:59:05PM Patient Phone: [phone] PCP: Sanda Linger Patient Gender: Female PCP Fax : Patient DOB: 17-Jul-1946 Practice Name: [facility] Reason for Call: Caller: [name]/Patient; PCP: Sanda Linger; CB#: [phone]; Call regarding BP Is 202/102; Pt calling regarding her BP is 202/102. Her Clonodine was increased to 0.3 mg 02/21/12 and states she thinks this med making her sick and BP elevating. Some blurred vision. Emergent s/s for Hypertension, Diagnosed, Suspected r/o per protocol except for see in 4 hrs due to systolic BP of more than 180. Will proceed to [facility]. Protocol(s) Used: Hypertension, Diagnosed or Suspected Recommended Outcome per Protocol: See Provider within 4 hours Reason for Outcome: Systolic blood pressure of more than 180 mmHg OR diastolic blood pressure of more than 120 mmHg Care Advice: ~ Another adult should drive. Call EMS 911 if new symptoms develop, such as severe shortness of breath, chest pain, change in mental status, acute neurologic deficit, seizure, visual disturbances, pulse rate > 120 / minute, or very irregular pulse. ~ ~ Call provider if symptoms worsen or new symptoms develop. ~ HEALTH [employer]OMOTION / MAINTENANCE Medication Advice: - Discontinue all nonprescription and alternative medications, especially stimulants, until evaluated by provider. - Take prescribed medications as directed, following label instructions for the medication. - Do not change medications or dosing regimen until provider is consulted. - Know possible side effects of medication and what to do if they occur. - Tell provider all prescription, nonprescription or alternative medications that you take ~ 02/26/2012 9:09:21PM Page 1 of 1 [id number]

## 2012-02-28 ENCOUNTER — Other Ambulatory Visit (INDEPENDENT_AMBULATORY_CARE_PROVIDER_SITE_OTHER): Payer: Medicare Other

## 2012-02-28 ENCOUNTER — Ambulatory Visit (INDEPENDENT_AMBULATORY_CARE_PROVIDER_SITE_OTHER): Payer: Medicare Other | Admitting: Internal Medicine

## 2012-02-28 ENCOUNTER — Encounter: Payer: Self-pay | Admitting: Internal Medicine

## 2012-02-28 VITALS — BP 178/120 | HR 56 | Temp 99.0°F | Resp 16

## 2012-02-28 DIAGNOSIS — I10 Essential (primary) hypertension: Secondary | ICD-10-CM

## 2012-02-28 DIAGNOSIS — N390 Urinary tract infection, site not specified: Secondary | ICD-10-CM

## 2012-02-28 LAB — URINALYSIS, ROUTINE W REFLEX MICROSCOPIC
Bilirubin Urine: NEGATIVE
Ketones, ur: NEGATIVE
Total Protein, Urine: NEGATIVE
Urine Glucose: NEGATIVE
pH: 7.5 (ref 5.0–8.0)

## 2012-02-28 MED ORDER — HYDROCHLOROTHIAZIDE 12.5 MG PO CAPS: 12.5000 mg | ORAL_CAPSULE | Freq: Every day | ORAL | Status: DC

## 2012-02-28 NOTE — Progress Notes (Signed)
Subjective:    Patient ID: [name], female    DOB: May 25, 1946, 66 y.o.   MRN: [id number]  Hypertension This is a chronic problem. The current episode started more than 1 year ago. The problem has been rapidly worsening since onset. The problem is resistant. Associated symptoms include headaches. Pertinent negatives include no anxiety, blurred vision, chest pain, malaise/fatigue, neck pain, orthopnea, palpitations, peripheral edema, PND, shortness of breath or sweats. Past treatments include beta blockers, diuretics, angiotensin blockers and central alpha agonists. The current treatment provides no improvement. Compliance problems include medication side effects (clonidine caused nausea).       Review of Systems  Constitutional: Negative for fever, chills, malaise/fatigue, diaphoresis, activity change, appetite change, fatigue and unexpected weight change.  HENT: Negative for neck pain.   Eyes: Negative.  Negative for blurred vision.  Respiratory: Negative for apnea, cough, choking, chest tightness, shortness of breath, wheezing and stridor.   Cardiovascular: Negative for chest pain, palpitations, orthopnea, leg swelling and PND.  Gastrointestinal: Negative for nausea, vomiting, abdominal pain, diarrhea, constipation and anal bleeding.  Genitourinary: Positive for dysuria. Negative for urgency, frequency, flank pain, decreased urine volume, difficulty urinating and dyspareunia.  Musculoskeletal: Negative for myalgias, back pain, joint swelling, arthralgias and gait problem.  Skin: Negative for color change, pallor, rash and wound.  Neurological: Positive for headaches. Negative for dizziness, tremors, seizures, syncope, facial asymmetry, speech difficulty, weakness, light-headedness and numbness.  Hematological: Negative for adenopathy. Does not bruise/bleed easily.  Psychiatric/Behavioral: Negative.        Objective:   Physical Exam  Vitals reviewed. Constitutional: She is oriented to  person, place, and time. She appears well-developed and well-nourished. No distress.  HENT:  Head: Normocephalic and atraumatic.  Mouth/Throat: Oropharynx is clear and moist. No oropharyngeal exudate.  Eyes: Conjunctivae are normal. Right eye exhibits no discharge. Left eye exhibits no discharge. No scleral icterus.  Neck: Normal range of motion. Neck supple. No JVD present. No tracheal deviation present. No thyromegaly present.  Cardiovascular: Normal rate, regular rhythm, normal heart sounds and intact distal pulses.  Exam reveals no gallop and no friction rub.   No murmur heard. Pulmonary/Chest: Effort normal and breath sounds normal. No stridor. No respiratory distress. She has no wheezes. She has no rales. She exhibits no tenderness.  Abdominal: Soft. Bowel sounds are normal. She exhibits no distension and no mass. There is no tenderness. There is no rebound and no guarding.  Musculoskeletal: Normal range of motion. She exhibits no edema and no tenderness.  Lymphadenopathy:    She has no cervical adenopathy.  Neurological: She is oriented to person, place, and time.  Skin: Skin is warm and dry. No rash noted. She is not diaphoretic. No erythema. No pallor.  Psychiatric: She has a normal mood and affect. Her behavior is normal. Judgment and thought content normal.     Lab Results  Component Value Date   WBC 5.1 12/15/2011   HGB 15.0 02/27/2012   HCT 44.0 02/27/2012   PLT 173.0 12/15/2011   GLUCOSE 160* 02/27/2012   CHOL 151 08/24/2011   TRIG 82.0 08/24/2011   HDL 42.80 08/24/2011   LDLCALC 92 08/24/2011   ALT 31 12/15/2011   AST 22 12/15/2011   NA 142 02/27/2012   K 3.2* 02/27/2012   CL 101 02/27/2012   CREATININE 1.00 02/27/2012   BUN 15 02/27/2012   CO2 32 02/15/2012   TSH 1.41 08/24/2011   INR 0.9 10/12/2007   HGBA1C 6.2 12/15/2011  Assessment & Plan:

## 2012-02-28 NOTE — Patient Instructions (Signed)

## 2012-02-28 NOTE — Assessment & Plan Note (Signed)
I will check her UA and urine clx today 

## 2012-02-28 NOTE — Assessment & Plan Note (Signed)
Her BP is not well controlled, she needs to stop clonidine due to side effects, I have asked her to add on another 15mg  of HCTZ and to see nephrology for eval and treatment of refractory hypertension

## 2012-02-29 LAB — CULTURE, URINE COMPREHENSIVE: Colony Count: NO GROWTH

## 2012-03-05 ENCOUNTER — Telehealth: Payer: Self-pay | Admitting: Internal Medicine

## 2012-03-05 NOTE — Telephone Encounter (Signed)
Message copied by Etheleen Sia on Mon Mar 05, 2012  3:46 PM ------      Message from: Etheleen Sia      Created: Mon Mar 05, 2012  8:36 AM      Regarding: FW: SWITCH PCP       Pt is aware.      ----- Message -----         From: Jacques Navy, MD         Sent: 02/29/2012   5:08 AM           To: Etheleen Sia      Subject: RE: [facility] PCP                                           Not ok - she needs to stay with a single doctor who knows her already      ----- Message -----         From: Etheleen Sia         Sent: 02/28/2012  11:55 AM           To: Tawni Pummel, MD, #      Subject: [facility] PCP                                               [name] would like to switch PCP from Dr. Yetta Barre to Dr. Debby Bud.      Will this be ok?            [phone]

## 2012-03-06 ENCOUNTER — Other Ambulatory Visit: Payer: Self-pay | Admitting: Internal Medicine

## 2012-03-26 ENCOUNTER — Ambulatory Visit (INDEPENDENT_AMBULATORY_CARE_PROVIDER_SITE_OTHER): Payer: Medicare Other | Admitting: Internal Medicine

## 2012-03-26 ENCOUNTER — Encounter: Payer: Self-pay | Admitting: Internal Medicine

## 2012-03-26 ENCOUNTER — Other Ambulatory Visit (INDEPENDENT_AMBULATORY_CARE_PROVIDER_SITE_OTHER): Payer: Medicare Other

## 2012-03-26 VITALS — BP 178/98 | HR 60 | Temp 99.1°F | Resp 16 | Wt 168.0 lb

## 2012-03-26 DIAGNOSIS — I10 Essential (primary) hypertension: Secondary | ICD-10-CM

## 2012-03-26 LAB — BASIC METABOLIC PANEL
Calcium: 10 mg/dL (ref 8.4–10.5)
GFR: 99.08 mL/min (ref >60.00)
Potassium: 3.6 mEq/L (ref 3.5–5.1)
Sodium: 139 mEq/L (ref 135–145)

## 2012-03-26 MED ORDER — FUROSEMIDE 20 MG PO TABS: 20.0000 mg | ORAL_TABLET | Freq: Two times a day (BID) | ORAL | Status: DC

## 2012-03-26 NOTE — Patient Instructions (Signed)

## 2012-03-26 NOTE — Progress Notes (Signed)
  Subjective:    Patient ID: [name], female    DOB: Apr 18, 1946, 66 y.o.   MRN: [id number]  Hypertension This is a chronic problem. The current episode started more than 1 year ago. The problem is unchanged. The problem is uncontrolled. Pertinent negatives include no anxiety, blurred vision, chest pain, headaches, malaise/fatigue, neck pain, orthopnea, palpitations, peripheral edema, PND, shortness of breath or sweats. Past treatments include diuretics, beta blockers and angiotensin blockers. The current treatment provides mild improvement. There are no compliance problems.       Review of Systems  Constitutional: Negative for fever, chills, malaise/fatigue, diaphoresis, activity change, appetite change, fatigue and unexpected weight change.  HENT: Negative.  Negative for neck pain.   Eyes: Negative.  Negative for blurred vision.  Respiratory: Negative for cough, chest tightness, shortness of breath, wheezing and stridor.   Cardiovascular: Negative for chest pain, palpitations, orthopnea, leg swelling and PND.  Gastrointestinal: Negative for nausea, vomiting, abdominal pain, diarrhea, constipation, blood in stool and abdominal distention.  Genitourinary: Negative.  Negative for dysuria, urgency, frequency, hematuria, flank pain, enuresis, difficulty urinating and dyspareunia.  Musculoskeletal: Negative.   Skin: Negative for color change, pallor and wound.  Neurological: Negative.  Negative for headaches.  Hematological: Negative for adenopathy. Does not bruise/bleed easily.  Psychiatric/Behavioral: Negative.        Objective:   Physical Exam  Vitals reviewed. Constitutional: She is oriented to person, place, and time. She appears well-developed and well-nourished. No distress.  HENT:  Head: Normocephalic and atraumatic.  Mouth/Throat: Oropharynx is clear and moist. No oropharyngeal exudate.  Eyes: Conjunctivae are normal. Right eye exhibits no discharge. Left eye exhibits no  discharge. No scleral icterus.  Neck: Normal range of motion. Neck supple. No JVD present. No tracheal deviation present. No thyromegaly present.  Cardiovascular: Normal rate, regular rhythm, normal heart sounds and intact distal pulses.  Exam reveals no gallop and no friction rub.   No murmur heard. Pulmonary/Chest: Effort normal and breath sounds normal. No stridor. No respiratory distress. She has no wheezes. She has no rales. She exhibits no tenderness.  Abdominal: Soft. Bowel sounds are normal. She exhibits no distension and no mass. There is no tenderness. There is no rebound and no guarding.  Musculoskeletal: Normal range of motion. She exhibits edema (trace edema in BLE). She exhibits no tenderness.  Lymphadenopathy:    She has no cervical adenopathy.  Neurological: She is oriented to person, place, and time.  Skin: Skin is warm and dry. No rash noted. She is not diaphoretic. No erythema. No pallor.  Psychiatric: She has a normal mood and affect. Her behavior is normal. Judgment and thought content normal.      Lab Results  Component Value Date   WBC 5.1 12/15/2011   HGB 15.0 02/27/2012   HCT 44.0 02/27/2012   PLT 173.0 12/15/2011   GLUCOSE 160* 02/27/2012   CHOL 151 08/24/2011   TRIG 82.0 08/24/2011   HDL 42.80 08/24/2011   LDLCALC 92 08/24/2011   ALT 31 12/15/2011   AST 22 12/15/2011   NA 142 02/27/2012   K 3.2* 02/27/2012   CL 101 02/27/2012   CREATININE 1.00 02/27/2012   BUN 15 02/27/2012   CO2 32 02/15/2012   TSH 1.41 08/24/2011   INR 0.9 10/12/2007   HGBA1C 6.2 12/15/2011      Assessment & Plan:

## 2012-03-26 NOTE — Assessment & Plan Note (Addendum)
Her BP is not well controlled so I have added a loop diuretic, she sees nephrology tomorrow for further evaluation, she will continue the other meds without any changes, I will recheck her lytes and renal function today

## 2012-04-04 ENCOUNTER — Other Ambulatory Visit: Payer: Self-pay | Admitting: Internal Medicine

## 2012-04-09 ENCOUNTER — Other Ambulatory Visit: Payer: Self-pay | Admitting: Cardiology

## 2012-04-09 DIAGNOSIS — N189 Chronic kidney disease, unspecified: Secondary | ICD-10-CM

## 2012-04-11 ENCOUNTER — Encounter (INDEPENDENT_AMBULATORY_CARE_PROVIDER_SITE_OTHER): Payer: Medicare Other

## 2012-04-11 DIAGNOSIS — I10 Essential (primary) hypertension: Secondary | ICD-10-CM

## 2012-04-11 DIAGNOSIS — N189 Chronic kidney disease, unspecified: Secondary | ICD-10-CM

## 2012-04-27 ENCOUNTER — Encounter: Payer: Self-pay | Admitting: Internal Medicine

## 2012-07-03 ENCOUNTER — Other Ambulatory Visit: Payer: Self-pay | Admitting: Internal Medicine

## 2012-08-20 ENCOUNTER — Other Ambulatory Visit: Payer: Self-pay | Admitting: Internal Medicine

## 2012-09-18 ENCOUNTER — Encounter (HOSPITAL_COMMUNITY): Payer: Self-pay | Admitting: *Deleted

## 2012-09-18 ENCOUNTER — Emergency Department (HOSPITAL_COMMUNITY): Payer: Medicare Other

## 2012-09-18 ENCOUNTER — Emergency Department (HOSPITAL_COMMUNITY)
Admission: EM | Admit: 2012-09-18 | Discharge: 2012-09-18 | Disposition: A | Discharge status: Home / Self Care | Payer: Medicare Other | Attending: Emergency Medicine | Admitting: Emergency Medicine

## 2012-09-18 DIAGNOSIS — I10 Essential (primary) hypertension: Secondary | ICD-10-CM | POA: Insufficient documentation

## 2012-09-18 DIAGNOSIS — M542 Cervicalgia: Secondary | ICD-10-CM | POA: Insufficient documentation

## 2012-09-18 DIAGNOSIS — Z79899 Other long term (current) drug therapy: Secondary | ICD-10-CM | POA: Insufficient documentation

## 2012-09-18 DIAGNOSIS — M25519 Pain in unspecified shoulder: Secondary | ICD-10-CM | POA: Insufficient documentation

## 2012-09-18 DIAGNOSIS — Z8739 Personal history of other diseases of the musculoskeletal system and connective tissue: Secondary | ICD-10-CM | POA: Insufficient documentation

## 2012-09-18 DIAGNOSIS — E785 Hyperlipidemia, unspecified: Secondary | ICD-10-CM | POA: Insufficient documentation

## 2012-09-18 DIAGNOSIS — E119 Type 2 diabetes mellitus without complications: Secondary | ICD-10-CM | POA: Insufficient documentation

## 2012-09-18 DIAGNOSIS — Z87891 Personal history of nicotine dependence: Secondary | ICD-10-CM | POA: Insufficient documentation

## 2012-09-18 DIAGNOSIS — M549 Dorsalgia, unspecified: Secondary | ICD-10-CM

## 2012-09-18 DIAGNOSIS — M546 Pain in thoracic spine: Secondary | ICD-10-CM | POA: Insufficient documentation

## 2012-09-18 MED ORDER — HYDROCODONE-ACETAMINOPHEN 5-325 MG PO TABS
1.0000 | ORAL_TABLET | Freq: Once | ORAL | Status: AC
  Administered 2012-09-18: 1 via ORAL
  Filled 2012-09-18: qty 1

## 2012-09-18 MED ORDER — HYDROCODONE-ACETAMINOPHEN 5-325 MG PO TABS: 2.0000 | ORAL_TABLET | Freq: Four times a day (QID) | ORAL | Status: DC | PRN

## 2012-09-18 MED ORDER — IBUPROFEN 400 MG PO TABS
400.0000 mg | ORAL_TABLET | Freq: Once | ORAL | Status: AC
  Administered 2012-09-18: 400 mg via ORAL
  Filled 2012-09-18: qty 1

## 2012-09-18 NOTE — ED Notes (Signed)
Reports upper back pain x 2 weeks, denies injury to back. Ambulatory at triage.

## 2012-09-18 NOTE — ED Notes (Signed)
Patient transported to X-ray 

## 2012-09-18 NOTE — ED Provider Notes (Addendum)
History   This chart was scribed for Ruth Roots, MD by Charolett Bumpers, ED Scribe. The patient was seen in room TR05C/TR05C. Patient's care was started at 1136.   CSN: [id number]  Arrival date & time 09/18/12  1121   First MD Initiated Contact with Patient 09/18/12 1136      Chief Complaint  Patient presents with  . Back Pain    The history is provided by the patient. No language interpreter was used.   [name] is a 66 y.o. female who presents to the Emergency Department complaining of 2 weeks of constant, dull upper back pain that radiates to her right arm. She denies any pain with turning her neck. She denies any numbness or weakness. She denies any GU or GI symptoms. She denies any fever, vomiting. She denies any recent injuries, falls or heavy lifting. She states that she was seen at a [facility] for the same back pain and given pain medication and a muscle relaxer with no relief. Notes no xrays done. No hx ddd. No numbness/weakness. No cp or sob. No cough or uri c/o. No fever or chill.    Past Medical History  Diagnosis Date  . Hyperlipidemia   . Hypertension   . Osteoarthritis   . Diabetes mellitus     type 2    Past Surgical History  Procedure Date  . Tubal ligation     Family History  Problem Relation Age of Onset  . Hypertension Other     History  Substance Use Topics  . Smoking status: Former Games developer  . Smokeless tobacco: Not on file  . Alcohol Use: No    OB History    Grav Para Term Preterm Abortions TAB SAB Ect Mult Living                  Review of Systems  Constitutional: Negative for fever and chills.  Respiratory: Negative for shortness of breath.   Cardiovascular: Negative for chest pain.  Gastrointestinal: Negative for vomiting and abdominal pain.  Genitourinary: Negative for difficulty urinating.  Musculoskeletal: Positive for back pain.  Neurological: Negative for weakness and numbness.  All other systems reviewed and are  negative.    Allergies  Amlodipine; Clonidine derivatives; Lisinopril; Metformin; and Pioglitazone  Home Medications   Current Outpatient Rx  Name  Route  Sig  Dispense  Refill  . BD PEN NEEDLE SHORT U/F 31G X 8 MM MISC      USE AS DIRECTED   100 each   0   . CARVEDILOL 12.5 MG PO TABS   Oral   Take 2 tablets (25 mg total) by mouth 2 (two) times daily with a meal.   120 tablet   4   . CARVEDILOL 12.5 MG PO TABS      TAKE 2 TABLETS BY MOUTH TWICE DAILY   120 tablet   2   . FUROSEMIDE 20 MG PO TABS   Oral   Take 1 tablet (20 mg total) by mouth 2 (two) times daily.   60 tablet   5   . GLUCOSE BLOOD VI STRP      Use BID   100 strip   11   . HYDROCHLOROTHIAZIDE 12.5 MG PO CAPS   Oral   Take 1 capsule (12.5 mg total) by mouth daily.   90 capsule   3   . KLOR-CON M15 15 MEQ PO TBCR      TAKE 1 TABLET BY MOUTH TWICE DAILY  180 tablet   0   . LIRAGLUTIDE 18 MG/3ML [place] SOLN   Subcutaneous   Inject 1.2 mLs into the skin daily.         Marland Kitchen OLMESARTAN MEDOXOMIL-HCTZ 40-25 MG PO TABS   Oral   Take 1 tablet by mouth daily.         Marland Kitchen ROSUVASTATIN CALCIUM 20 MG PO TABS   Oral   Take 40 mg by mouth daily.          Marland Kitchen SAXAGLIPTIN HCL 2.5 MG PO TABS   Oral   Take 2.5 mg by mouth daily.         Marland Kitchen VICTOZA 18 MG/3ML [place] SOLN      INJECT 0.3 ML (1.8 MG TOTAL) INTO SKIN DAILY   9 mL   5     BP 147/86  Pulse 83  Temp 98 F (36.7 C) (Oral)  Resp 18  SpO2 100%  Physical Exam  Nursing note and vitals reviewed. Constitutional: She is oriented to person, place, and time. She appears well-developed and well-nourished. No distress.  HENT:  Head: Normocephalic and atraumatic.  Eyes: Conjunctivae normal and EOM are normal. No scleral icterus.  Neck: Neck supple. No tracheal deviation present.  Cardiovascular: Normal rate.   Pulmonary/Chest: Effort normal. No respiratory distress.  Abdominal: Soft. There is no tenderness.  Musculoskeletal: Normal range of  motion. She exhibits tenderness. She exhibits no edema.       Upper thoracic tenderness, otherwise CTLS spine, non tender, aligned, no step off.   Neurological: She is alert and oriented to person, place, and time.       Motor intact bil. Steady gait.   Skin: Skin is warm and dry. No rash noted.       No shingles/rash in area of pain  Psychiatric: She has a normal mood and affect. Her behavior is normal.    ED Course  Procedures (including critical care time)  DIAGNOSTIC STUDIES: Oxygen Saturation is 100% on room air, normal by my interpretation.    COORDINATION OF CARE:  11:43-Discussed planned course of treatment with the patient including pain medication and an x-ray of the thoracic spine, who is agreeable at this time. Pt is not driving and has a ride.   11:45-Medication Orders: Ibuprofen (Advil, Motrin) tablet 400 mg-once; Hydrocodone-acetaminophen (Norco/Vicodin) 5-325 mg per tablet 1 tablet-once.   Dg Thoracic Spine 2 View  09/18/2012  *RADIOLOGY REPORT*  Clinical Data: Upper mid back pain extending to the right arm.  THORACIC SPINE - 2 VIEW  Comparison: 09/24/2008  Findings: Tortuous thoracic aorta noted.  Lower thoracic spondylosis is observed with spurring anterior to the vertebral body column.  Cervical spondylosis and degenerative disc disease is present with loss of intervertebral disc height at C3-4, C4-5, C5- 6, and likely C6-7.  No fracture or malalignment identified.  IMPRESSION:  1.  Cervical and thoracic spondylosis, with mid cervical degenerative disc disease. 2.  No acute findings.   Original Report Authenticated By: Gaylyn Rong, M.D.        MDM  I personally performed the services described in this documentation, which was scribed in my presence. The recorded information has been reviewed and is accurate.   Tenderness esp right upper thoracic area.    Xrays.  Pt states has ride, does not have to drive. vicodin po. Motrin po.   Discussed xray results  w pt.       Ruth Roots, MD 09/18/12 1242  Ruth Roots, MD  09/18/12 1246 

## 2012-09-18 NOTE — ED Notes (Signed)
Pt. Stated. i've had rt. Side neck pain and shoulder pain for 2 weeks and now its going down my rt. Arm.  I can't see my Dr. Until next week.

## 2012-09-21 ENCOUNTER — Other Ambulatory Visit: Payer: Self-pay

## 2012-09-21 MED ORDER — LIRAGLUTIDE 18 MG/3ML ~~LOC~~ SOLN: SUBCUTANEOUS | Status: AC

## 2012-09-28 ENCOUNTER — Other Ambulatory Visit: Payer: Self-pay | Admitting: Internal Medicine

## 2012-10-09 ENCOUNTER — Other Ambulatory Visit: Payer: Self-pay | Admitting: Internal Medicine

## 2012-10-18 ENCOUNTER — Other Ambulatory Visit: Payer: Self-pay | Admitting: Internal Medicine

## 2012-10-18 ENCOUNTER — Other Ambulatory Visit: Payer: Self-pay | Admitting: Family Medicine

## 2012-10-18 DIAGNOSIS — Z1231 Encounter for screening mammogram for malignant neoplasm of breast: Secondary | ICD-10-CM

## 2012-11-21 ENCOUNTER — Ambulatory Visit
Admission: RE | Admit: 2012-11-21 | Discharge: 2012-11-21 | Disposition: A | Discharge status: Home / Self Care | Payer: Medicare Other | Source: Ambulatory Visit | Attending: Family Medicine | Admitting: Family Medicine

## 2012-11-21 DIAGNOSIS — Z1231 Encounter for screening mammogram for malignant neoplasm of breast: Secondary | ICD-10-CM

## 2012-11-25 ENCOUNTER — Other Ambulatory Visit: Payer: Self-pay | Admitting: Internal Medicine

## 2012-12-19 ENCOUNTER — Other Ambulatory Visit: Payer: Self-pay | Admitting: Internal Medicine

## 2013-10-18 ENCOUNTER — Other Ambulatory Visit: Payer: Self-pay

## 2013-10-18 ENCOUNTER — Other Ambulatory Visit (HOSPITAL_COMMUNITY): Payer: Self-pay | Admitting: Family Medicine

## 2013-10-18 DIAGNOSIS — Z1231 Encounter for screening mammogram for malignant neoplasm of breast: Secondary | ICD-10-CM

## 2013-11-22 ENCOUNTER — Ambulatory Visit
Admission: RE | Admit: 2013-11-22 | Discharge: 2013-11-22 | Disposition: A | Discharge status: Home / Self Care | Payer: Medicare Other | Source: Ambulatory Visit

## 2013-11-22 DIAGNOSIS — Z1231 Encounter for screening mammogram for malignant neoplasm of breast: Secondary | ICD-10-CM

## 2013-12-25 ENCOUNTER — Other Ambulatory Visit: Payer: Self-pay | Admitting: Internal Medicine

## 2014-05-28 ENCOUNTER — Telehealth: Payer: Self-pay

## 2014-05-28 NOTE — Telephone Encounter (Signed)
Called patient//LMOVM needing to know if Dr. Yetta Barre is still her PCP and if so, need to schedule a medicare physical.

## 2014-06-30 ENCOUNTER — Telehealth: Payer: Self-pay

## 2014-06-30 NOTE — Telephone Encounter (Signed)
Deleted Dr. Yetta BarreJones as her PCP.

## 2014-06-30 NOTE — Telephone Encounter (Signed)
Pt states that she has an appointment scheduled for December of this year.  She also states that Dr. Yetta BarreJones is no longer her PCP.

## 2014-09-09 ENCOUNTER — Ambulatory Visit: Payer: Self-pay | Admitting: Podiatry

## 2014-09-16 ENCOUNTER — Encounter: Payer: Self-pay | Admitting: Podiatry

## 2014-09-16 ENCOUNTER — Ambulatory Visit (INDEPENDENT_AMBULATORY_CARE_PROVIDER_SITE_OTHER): Payer: Medicare Other | Admitting: Podiatry

## 2014-09-16 VITALS — BP 156/70 | HR 63 | Resp 16 | Ht 62.0 in | Wt 170.0 lb

## 2014-09-16 DIAGNOSIS — L600 Ingrowing nail: Secondary | ICD-10-CM

## 2014-09-16 MED ORDER — NEOMYCIN-POLYMYXIN-HC 1 % OT SOLN: OTIC | Status: AC

## 2014-09-16 NOTE — Patient Instructions (Signed)

## 2014-09-16 NOTE — Progress Notes (Signed)
   Subjective:    Patient ID: [name], female    DOB: 12/01/1945, 68 y.o.   MRN: [id number]  HPI Comments: "I have an ingrown"  Patient c/o tender 1st toe left, lateral border, for several months. The 2nd toe sits tightly against the big toe. The toenail is discolored and deformed. Keeps trimmed out.     Review of Systems  Constitutional: Positive for appetite change, fatigue and unexpected weight change.  HENT: Positive for sinus pressure, sneezing and sore throat.   Eyes: Positive for pain and visual disturbance.  Respiratory: Positive for apnea, cough, chest tightness and wheezing.   Cardiovascular: Positive for palpitations.  Gastrointestinal: Positive for diarrhea, constipation and abdominal distention.  Endocrine: Positive for cold intolerance and heat intolerance.  Genitourinary: Positive for frequency.  Musculoskeletal: Positive for gait problem.  Skin: Positive for rash.  Allergic/Immunologic: Positive for environmental allergies.  Neurological: Positive for tremors, weakness and headaches.  Hematological: Bruises/bleeds easily.  Psychiatric/Behavioral: The patient is nervous/anxious.   All other systems reviewed and are negative.      Objective:   Physical Exam: I have reviewed her past medical history medications allergies surgery social history and review of systems. Pulses are strongly palpable. Neurologic sensorium are intact persons once the monofilament and deep tendon reflexes are intact bilateral. Muscle strength +5 over 5 dorsiflexors plantar flexors and inverters everters all intrinsic musculature appears to be intact. Orthopedic evaluation demonstrates mild HAV deformity hammertoe deformities bilateral. Cutaneous evaluation demonstrates fibular border of the hallux left with a sharp incurvated nail margin and pain on palpation. Mild erythema no cellulitis drainage or odor.        Assessment & Plan:  Assessment ingrown nail hallux left fibular  border.  Plan: We discussed the etiology pathology conservative versus surgical therapies. We performed a chemical matrixectomy which took place after local anesthetic had been administered. She tolerated the procedure well and phenol was used to destroy the nail bed and removed. She was given both oral and written home going instructions and has for the care of her toe as well as soaking instructions and a prescription for Cortisporin Otic. I will follow-up with her in 1 week should she have questions or concerns she will notify us immediately.

## 2014-09-23 ENCOUNTER — Ambulatory Visit: Payer: Medicare Other | Admitting: Podiatry

## 2014-10-29 ENCOUNTER — Other Ambulatory Visit (HOSPITAL_COMMUNITY): Payer: Self-pay | Admitting: Family Medicine

## 2014-10-29 DIAGNOSIS — Z1231 Encounter for screening mammogram for malignant neoplasm of breast: Secondary | ICD-10-CM

## 2014-12-10 IMAGING — CR DG THORACIC SPINE 2V
2 series · 2 of 2 positions shown · non-contrast
Comparison: 09/24/2008

CLINICAL DATA: Upper mid back pain extending to the right arm.

THORACIC SPINE - 2 VIEW

[t thoracic spine ap]
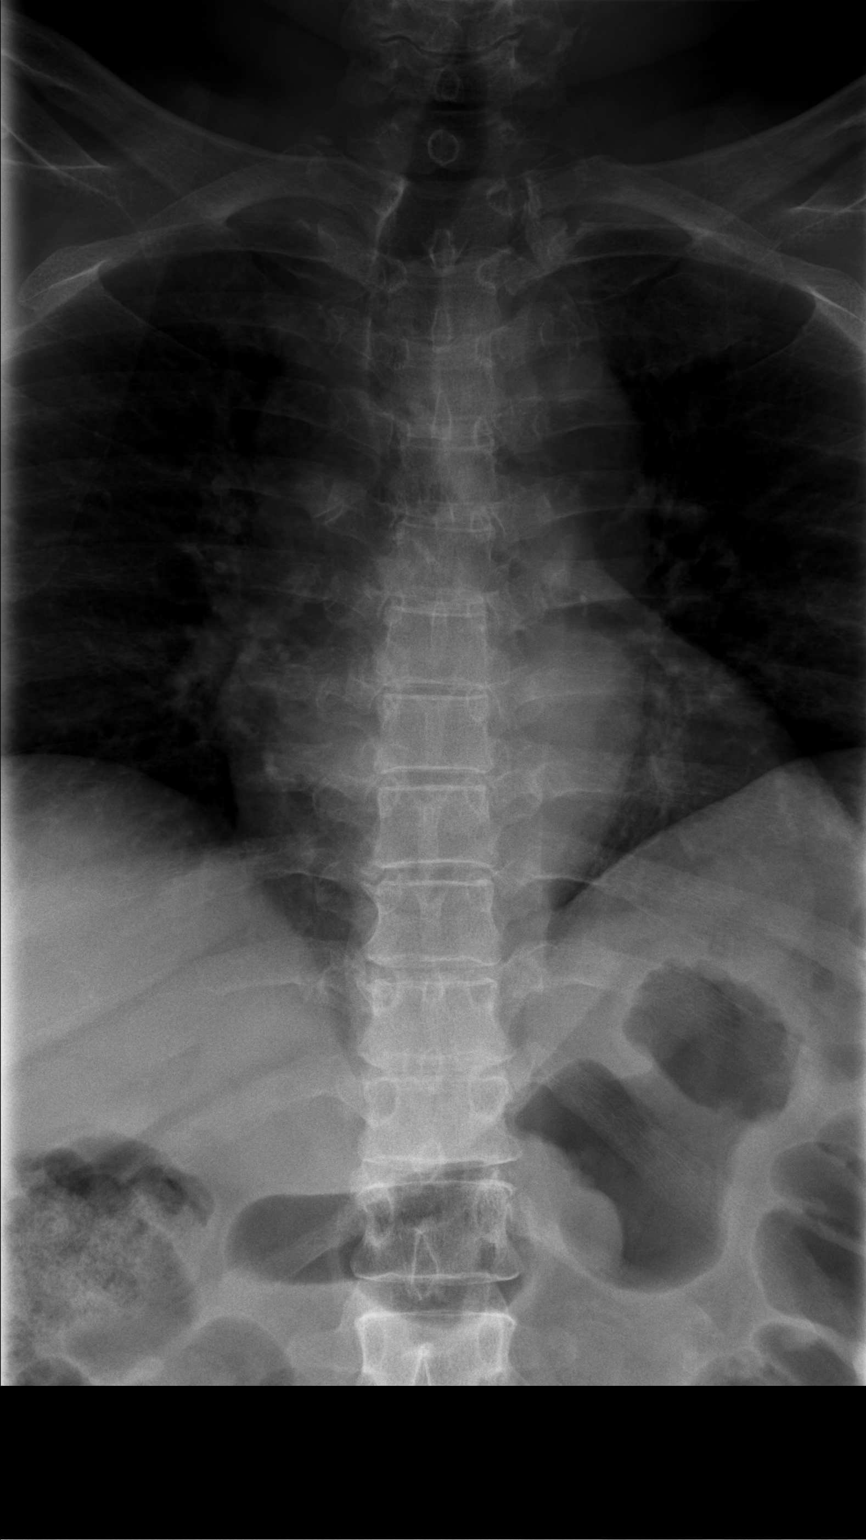

[t thoracic breathing lat]
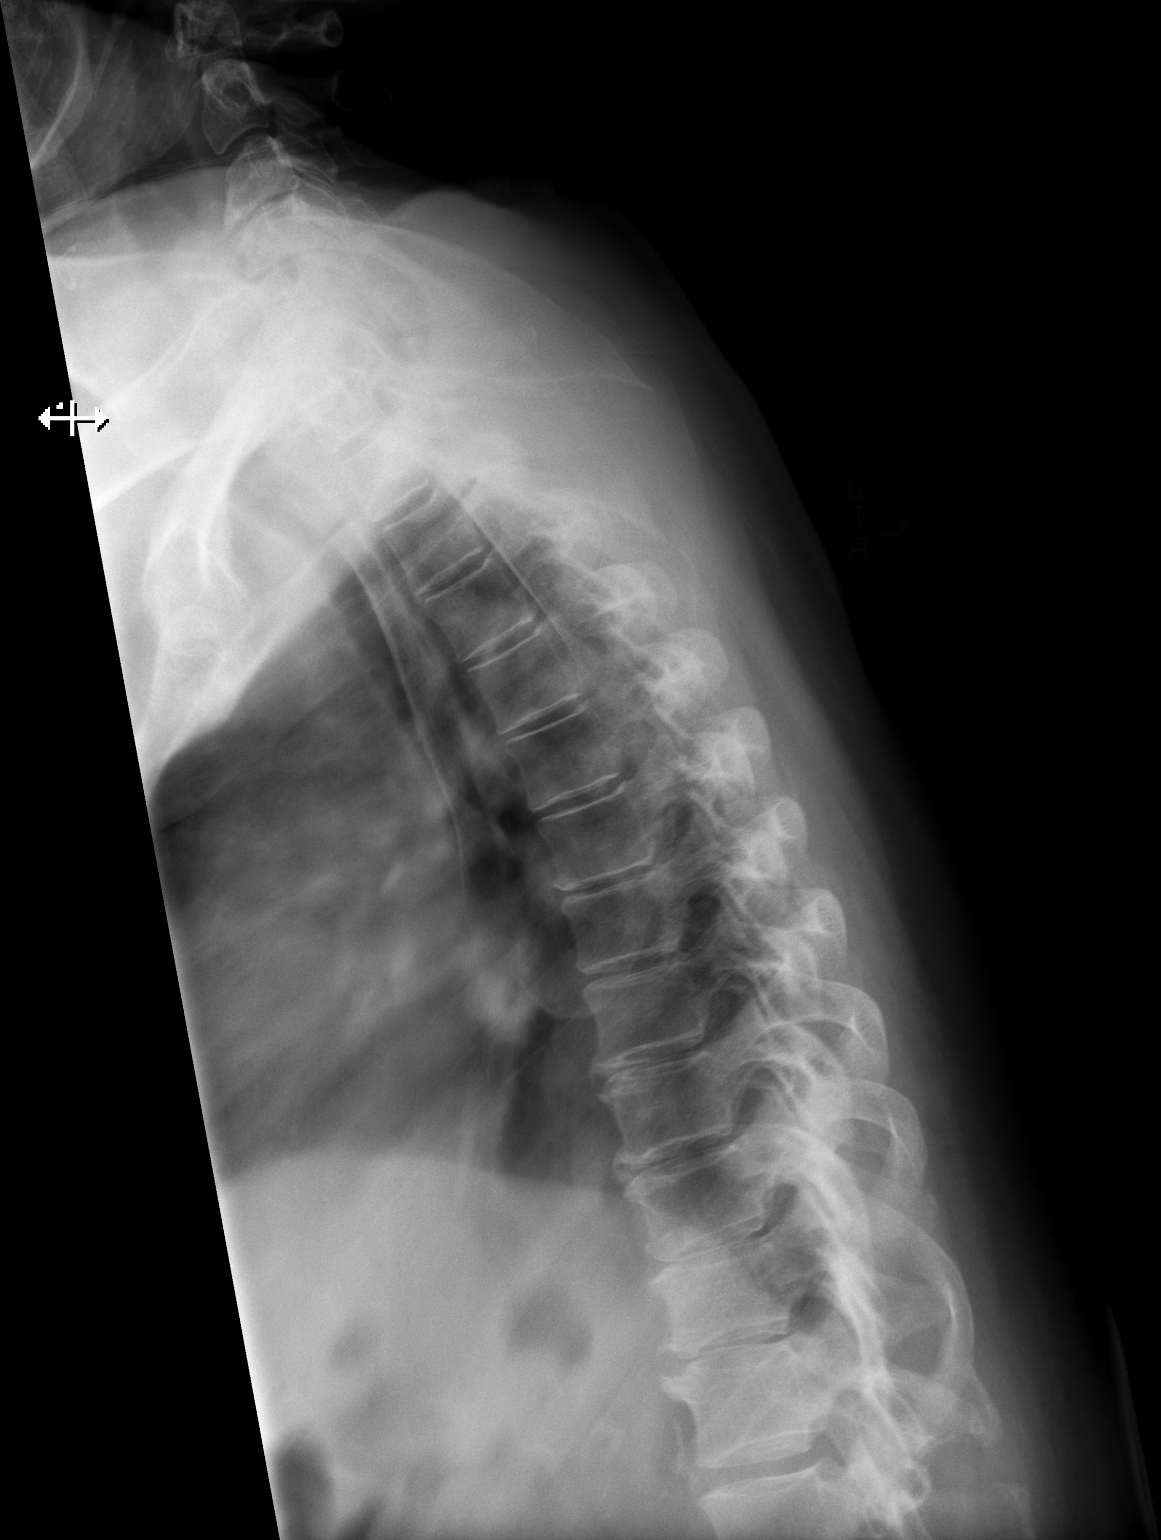

[2 of 2 positions shown; findings below may reference images not displayed]

FINDINGS: Tortuous thoracic aorta noted.  Lower thoracic
spondylosis is observed with spurring anterior to the vertebral
body column.  Cervical spondylosis and degenerative disc disease is
present with loss of intervertebral disc height at C3-4, C4-5, C5-
6, and likely C6-7.

No fracture or malalignment identified.
IMPRESSION: 1.  Cervical and thoracic spondylosis, with mid cervical
degenerative disc disease.
2.  No acute findings.

## 2016-03-02 ENCOUNTER — Other Ambulatory Visit: Payer: Self-pay | Admitting: Urology

## 2016-04-18 NOTE — Progress Notes (Signed)
Please place orders in EPIC as patient has a pre-op appointment on 04/21/2016 at [facility]- Surgical Testing! Thank you!

## 2016-04-21 ENCOUNTER — Encounter (HOSPITAL_COMMUNITY): Payer: Self-pay

## 2016-04-21 ENCOUNTER — Encounter (HOSPITAL_COMMUNITY)
Admission: RE | Admit: 2016-04-21 | Discharge: 2016-04-21 | Disposition: A | Discharge status: Home / Self Care | Payer: Medicare Other | Source: Ambulatory Visit | Attending: Urology | Admitting: Urology

## 2016-04-21 DIAGNOSIS — Z01812 Encounter for preprocedural laboratory examination: Secondary | ICD-10-CM | POA: Diagnosis not present

## 2016-04-21 DIAGNOSIS — E119 Type 2 diabetes mellitus without complications: Secondary | ICD-10-CM | POA: Insufficient documentation

## 2016-04-21 DIAGNOSIS — R001 Bradycardia, unspecified: Secondary | ICD-10-CM | POA: Diagnosis not present

## 2016-04-21 DIAGNOSIS — Z01818 Encounter for other preprocedural examination: Secondary | ICD-10-CM | POA: Insufficient documentation

## 2016-04-21 DIAGNOSIS — I10 Essential (primary) hypertension: Secondary | ICD-10-CM | POA: Insufficient documentation

## 2016-04-21 DIAGNOSIS — I498 Other specified cardiac arrhythmias: Secondary | ICD-10-CM | POA: Insufficient documentation

## 2016-04-21 DIAGNOSIS — N8189 Other female genital prolapse: Secondary | ICD-10-CM | POA: Diagnosis not present

## 2016-04-21 HISTORY — DX: Allergy status to unspecified drugs, medicaments and biological substances: Z88.9

## 2016-04-21 LAB — BASIC METABOLIC PANEL
ANION GAP: 7 (ref 5–15)
BUN: 12 mg/dL (ref 6–20)
CO2: 31 mmol/L (ref 22–32)
Calcium: 9.7 mg/dL (ref 8.9–10.3)
Chloride: 103 mmol/L (ref 101–111)
Creatinine, Ser: 0.98 mg/dL (ref 0.44–1.00)
GFR calc Af Amer: >60 mL/min (ref >60)
GFR, EST NON AFRICAN AMERICAN: 57 mL/min — AB (ref >60)
Glucose, Bld: 100 mg/dL — ABNORMAL HIGH (ref 65–99)
POTASSIUM: 3.4 mmol/L — AB (ref 3.5–5.1)
SODIUM: 141 mmol/L (ref 135–145)

## 2016-04-21 LAB — CBC
HEMATOCRIT: 43.6 % (ref 36.0–46.0)
HEMOGLOBIN: 14.8 g/dL (ref 12.0–15.0)
MCH: 29.2 pg (ref 26.0–34.0)
MCHC: 33.9 g/dL (ref 30.0–36.0)
MCV: 86.2 fL (ref 78.0–100.0)
Platelets: 171 10*3/uL (ref 150–400)
RBC: 5.06 MIL/uL (ref 3.87–5.11)
RDW: 13.5 % (ref 11.5–15.5)
WBC: 5.2 10*3/uL (ref 4.0–10.5)

## 2016-04-21 NOTE — Patient Instructions (Addendum)
Kyndell Paraiso  04/21/2016   Your procedure is scheduled on: 05-09-16   Report to [facility] Main  Entrance take [facility]  elevators to 3rd floor to  Short Stay Center at 0530  AM.  Call this number if you have problems the morning of surgery [phone]   Remember: ONLY 1 PERSON MAY GO WITH YOU TO SHORT STAY TO GET  READY MORNING OF YOUR SURGERY.  Do not eat food or drink liquids :After Midnight.     Take these medicines the morning of surgery with A SIP OF WATER: Diltiazem. Carvedilol. No Victoza- DO NOT TAKE ANY DIABETIC MEDICATIONS DAY OF YOUR SURGERY                               You may not have any metal on your body including hair pins and              piercings  Do not wear jewelry, make-up, lotions, powders or perfumes, deodorant             Do not wear nail polish.  Do not shave  48 hours prior to surgery.              Men may shave face and neck.   Do not bring valuables to the hospital. [facility] IS NOT             RESPONSIBLE   FOR VALUABLES.  Contacts, dentures or bridgework may not be worn into surgery.  Leave suitcase in the car. After surgery it may be brought to your room.     Patients discharged the day of surgery will not be allowed to drive home.  Name and phone number of your driver: Jilda Panda, son- [phone] cell  Special Instructions: N/A              Please read over the following fact sheets you were given: _____________________________________________________________________             [facility] - Preparing for Surgery Before surgery, you can play an important role.  Because skin is not sterile, your skin needs to be as free of germs as possible.  You can reduce the number of germs on your skin by washing with CHG (chlorahexidine gluconate) soap before surgery.  CHG is an antiseptic cleaner which kills germs and bonds with the skin to continue killing germs even after washing. Please DO NOT use if you have an allergy to CHG or  antibacterial soaps.  If your skin becomes reddened/irritated stop using the CHG and inform your nurse when you arrive at Short Stay. Do not shave (including legs and underarms) for at least 48 hours prior to the first CHG shower.  You may shave your face/neck. Please follow these instructions carefully:  1.  Shower with CHG Soap the night before surgery and the  morning of Surgery.  2.  If you choose to wash your hair, wash your hair first as usual with your  normal  shampoo.  3.  After you shampoo, rinse your hair and body thoroughly to remove the  shampoo.                           4.  Use CHG as you would any other liquid soap.  You can apply chg directly  to the skin and wash                       Gently with a scrungie or clean washcloth.  5.  Apply the CHG Soap to your body ONLY FROM THE NECK DOWN.   Do not use on face/ open                           Wound or open sores. Avoid contact with eyes, ears mouth and genitals (private parts).                       Wash face,  Genitals (private parts) with your normal soap.             6.  Wash thoroughly, paying special attention to the area where your surgery  will be performed.  7.  Thoroughly rinse your body with warm water from the neck down.  8.  DO NOT shower/wash with your normal soap after using and rinsing off  the CHG Soap.                9.  Pat yourself dry with a clean towel.            10.  Wear clean pajamas.            11.  Place clean sheets on your bed the night of your first shower and do not  sleep with pets. Day of Surgery : Do not apply any lotions/deodorants the morning of surgery.  Please wear clean clothes to the hospital/surgery center.  FAILURE TO FOLLOW THESE INSTRUCTIONS MAY RESULT IN THE CANCELLATION OF YOUR SURGERY PATIENT SIGNATURE_________________________________  NURSE SIGNATURE__________________________________  ________________________________________________________________________

## 2016-04-21 NOTE — Pre-Procedure Instructions (Signed)
EKG done today.

## 2016-04-22 LAB — HEMOGLOBIN A1C
HEMOGLOBIN A1C: 6 % — AB (ref 4.8–5.6)
Mean Plasma Glucose: 126 mg/dL

## 2016-05-04 NOTE — H&P (Signed)
  70 year old G 8 P 8 with symptomatic pelvic relaxation. She complains of a bulge in her vagina and urgency.  Past Medical History:  Diagnosis Date  . Diabetes mellitus    type 2  . Hx of seasonal allergies   . Hyperlipidemia   . Hypertension   . Osteoarthritis    Past Surgical History:  Procedure Laterality Date  . BREAST SURGERY Left    cystectomy of left breast  . TUBAL LIGATION     Prior to Admission medications   Medication Sig Start Date End Date Taking? Authorizing Provider  aspirin 81 MG tablet Take 81 mg by mouth daily.    Historical Provider, MD  carvedilol (COREG) 12.5 MG tablet TAKE 2 TABLETS BY MOUTH TWICE DAILY 09/28/12   Etta Grandchildhomas L Jones, MD  carvedilol (COREG) 12.5 MG tablet TAKE 2 TABLETS BY MOUTH TWICE DAILY Patient not taking: Reported on 04/19/2016 11/25/12   Etta Grandchildhomas L Jones, MD  diltiazem [facility](TIAZAC) 360 MG 24 hr capsule Take 360 mg by mouth daily.    Historical Provider, MD  fluticasone (FLONASE) 50 MCG/ACT nasal spray Place 1 spray into both nostrils daily as needed for allergies or rhinitis.    Historical Provider, MD  HYDROcodone-acetaminophen (NORCO/VICODIN) 5-325 MG per tablet Take 2 tablets by mouth every 6 (six) hours as needed for pain. Patient not taking: Reported on 04/19/2016 09/18/12   Cathren LaineKevin Steinl, MD  Liraglutide (VICTOZA) 18 MG/3ML SOLN INJECT 0.3 ML (1.8 MG TOTAL) INTO SKIN DAILY 09/21/12   Etta Grandchildhomas L Jones, MD  MICROLET LANCETS MISC Inject 1 Act into the skin 2 (two) times daily. 12/19/12   Etta Grandchildhomas L Jones, MD  naproxen sodium (ANAPROX) 220 MG tablet Take 220 mg by mouth 2 (two) times daily as needed (pain).    Historical Provider, MD  NEOMYCIN-POLYMYXIN-HYDROCORTISONE (CORTISPORIN) 1 % SOLN otic solution Apply 1-2 drops to the toe after soaking BID Patient not taking: Reported on 04/19/2016 09/16/14   Max T Hyatt, DPM  olmesartan (BENICAR) 40 MG tablet Take 40 mg by mouth daily.    Historical Provider, MD  rosuvastatin (CRESTOR) 20 MG tablet Take 20 mg by mouth  every evening.     Historical Provider, MD  terazosin (HYTRIN) 5 MG capsule Take 5 mg by mouth at bedtime.    Historical Provider, MD  VICTOZA 18 MG/3ML SOLN INJECT 0.3 ML (1.8 MG TOTAL) INTO SKIN DAILY Patient not taking: Reported on 04/19/2016 10/09/12   Etta Grandchildhomas L Jones, MD   Allergies: Amlodipine; Clonidine derivatives; Lisinopril; Metformin; and Pioglitazone  Family History  Problem Relation Age of Onset  . Hypertension Other    ROS unremarkable  There were no vitals taken for this visit.  General alert and oriented Lung CTAB Car RRR Abdomen is benign  Pelvic  Grade 2 to 3 cystocele Grade 2 to 3 uterine prolapse Cervix is elongated Grade 1 to 2 rectocele  IMPRESSION: Symptomatic Pelvic Relaxation  PLAN: Cystoscopy with stent placement [facility]  Dr Patsi Searsannenbaum will perform anterior repair  Patient may need posterior repair and perineoplasty depending on her support after the hysterectomy Risks are reviewed with the patient

## 2016-05-08 NOTE — Anesthesia Preprocedure Evaluation (Addendum)
Anesthesia Evaluation  Patient identified by MRN, date of birth, ID band Patient awake    Reviewed: Allergy & Precautions, NPO status , Patient's Chart, lab work & pertinent test results  History of Anesthesia Complications Negative for: history of anesthetic complications  Airway Mallampati: II  TM Distance: >3 FB Neck ROM: Full    Dental  (+) Edentulous Upper, Partial Lower   Pulmonary former smoker,    Pulmonary exam normal breath sounds clear to auscultation       Cardiovascular hypertension, Normal cardiovascular exam Rhythm:Regular Rate:Normal     Neuro/Psych negative neurological ROS  negative psych ROS   GI/Hepatic negative GI ROS, Neg liver ROS,   Endo/Other  diabetes  Renal/GU negative Renal ROS  negative genitourinary   Musculoskeletal  (+) Arthritis ,   Abdominal   Peds negative pediatric ROS (+)  Hematology negative hematology ROS (+)   Anesthesia Other Findings   Reproductive/Obstetrics negative OB ROS                            Anesthesia Physical Anesthesia Plan  ASA: II  Anesthesia Plan: General   Post-op Pain Management:    Induction: Intravenous  Airway Management Planned: Oral ETT  Additional Equipment:   Intra-op Plan:   Post-operative Plan: Extubation in OR  Informed Consent: I have reviewed the patients History and Physical, chart, labs and discussed the procedure including the risks, benefits and alternatives for the proposed anesthesia with the patient or authorized representative who has indicated his/her understanding and acceptance.   Dental advisory given  Plan Discussed with: CRNA  Anesthesia Plan Comments:         Anesthesia Quick Evaluation

## 2016-05-09 ENCOUNTER — Ambulatory Visit (HOSPITAL_COMMUNITY): Payer: Medicare Other

## 2016-05-09 ENCOUNTER — Ambulatory Visit (HOSPITAL_COMMUNITY): Payer: Medicare Other | Admitting: Anesthesiology

## 2016-05-09 ENCOUNTER — Inpatient Hospital Stay (HOSPITAL_BASED_OUTPATIENT_CLINIC_OR_DEPARTMENT_OTHER)
Admission: AD | Admit: 2016-05-09 | Discharge: 2016-05-10 | DRG: 743 | Disposition: A | Discharge status: Home / Self Care | Payer: Medicare Other | Source: Ambulatory Visit | Attending: Urology | Admitting: Urology

## 2016-05-09 ENCOUNTER — Encounter (HOSPITAL_COMMUNITY)
Admission: AD | Disposition: A | Discharge status: Home / Self Care | Payer: Self-pay | Source: Ambulatory Visit | Attending: Urology

## 2016-05-09 ENCOUNTER — Encounter (HOSPITAL_COMMUNITY): Payer: Self-pay | Admitting: Registered Nurse

## 2016-05-09 DIAGNOSIS — I10 Essential (primary) hypertension: Secondary | ICD-10-CM | POA: Diagnosis present

## 2016-05-09 DIAGNOSIS — E119 Type 2 diabetes mellitus without complications: Secondary | ICD-10-CM | POA: Diagnosis present

## 2016-05-09 DIAGNOSIS — Z8249 Family history of ischemic heart disease and other diseases of the circulatory system: Secondary | ICD-10-CM

## 2016-05-09 DIAGNOSIS — Z7982 Long term (current) use of aspirin: Secondary | ICD-10-CM

## 2016-05-09 DIAGNOSIS — M1990 Unspecified osteoarthritis, unspecified site: Secondary | ICD-10-CM | POA: Diagnosis present

## 2016-05-09 DIAGNOSIS — N3641 Hypermobility of urethra: Secondary | ICD-10-CM | POA: Diagnosis present

## 2016-05-09 DIAGNOSIS — Z79899 Other long term (current) drug therapy: Secondary | ICD-10-CM | POA: Diagnosis not present

## 2016-05-09 DIAGNOSIS — N8189 Other female genital prolapse: Secondary | ICD-10-CM | POA: Diagnosis present

## 2016-05-09 DIAGNOSIS — Z888 Allergy status to other drugs, medicaments and biological substances status: Secondary | ICD-10-CM

## 2016-05-09 DIAGNOSIS — E785 Hyperlipidemia, unspecified: Secondary | ICD-10-CM | POA: Diagnosis present

## 2016-05-09 DIAGNOSIS — N3946 Mixed incontinence: Secondary | ICD-10-CM | POA: Diagnosis present

## 2016-05-09 DIAGNOSIS — Z87891 Personal history of nicotine dependence: Secondary | ICD-10-CM | POA: Diagnosis not present

## 2016-05-09 DIAGNOSIS — N819 Female genital prolapse, unspecified: Secondary | ICD-10-CM | POA: Diagnosis present

## 2016-05-09 DIAGNOSIS — N814 Uterovaginal prolapse, unspecified: Secondary | ICD-10-CM | POA: Diagnosis present

## 2016-05-09 DIAGNOSIS — R32 Unspecified urinary incontinence: Secondary | ICD-10-CM | POA: Diagnosis present

## 2016-05-09 HISTORY — PX: VAGINAL HYSTERECTOMY: SHX2639

## 2016-05-09 HISTORY — PX: RECTOCELE REPAIR: SHX761

## 2016-05-09 HISTORY — PX: CYSTOSCOPY W/ URETERAL STENT PLACEMENT: SHX1429

## 2016-05-09 HISTORY — PX: PUBOVAGINAL SLING: SHX1035

## 2016-05-09 HISTORY — PX: CYSTOCELE REPAIR: SHX163

## 2016-05-09 HISTORY — PX: PERINEOPLASTY: SHX2218

## 2016-05-09 LAB — GLUCOSE, CAPILLARY
GLUCOSE-CAPILLARY: 175 mg/dL — AB (ref 65–99)
GLUCOSE-CAPILLARY: 132 mg/dL — AB (ref 65–99)
Glucose-Capillary: 135 mg/dL — ABNORMAL HIGH (ref 65–99)
Glucose-Capillary: 121 mg/dL — ABNORMAL HIGH (ref 65–99)

## 2016-05-09 LAB — HEMOGLOBIN AND HEMATOCRIT, BLOOD
HCT: 41.4 % (ref 36.0–46.0)
HEMOGLOBIN: 13.7 g/dL (ref 12.0–15.0)

## 2016-05-09 LAB — BASIC METABOLIC PANEL
Anion gap: 8 (ref 5–15)
BUN: 12 mg/dL (ref 6–20)
CALCIUM: 9.6 mg/dL (ref 8.9–10.3)
CHLORIDE: 104 mmol/L (ref 101–111)
CO2: 30 mmol/L (ref 22–32)
CREATININE: 0.93 mg/dL (ref 0.44–1.00)
GFR calc Af Amer: >60 mL/min (ref >60)
GFR calc non Af Amer: >60 mL/min (ref >60)
GLUCOSE: 158 mg/dL — AB (ref 65–99)
Potassium: 3.7 mmol/L (ref 3.5–5.1)
Sodium: 142 mmol/L (ref 135–145)

## 2016-05-09 SURGERY — CYSTOSCOPY, WITH RETROGRADE PYELOGRAM AND URETERAL STENT INSERTION
Anesthesia: General

## 2016-05-09 MED ORDER — MIDAZOLAM HCL 5 MG/5ML IJ SOLN
INTRAMUSCULAR | Status: DC | PRN
  Administered 2016-05-09 (×2): 1 mg via INTRAVENOUS

## 2016-05-09 MED ORDER — SODIUM CHLORIDE 0.9 % IR SOLN
Status: AC
  Filled 2016-05-09: qty 1

## 2016-05-09 MED ORDER — ROCURONIUM BROMIDE 100 MG/10ML IV SOLN
INTRAVENOUS | Status: AC
  Filled 2016-05-09: qty 1

## 2016-05-09 MED ORDER — FLUORESCEIN SODIUM 10 % IV SOLN
500.0000 mg | Freq: Once | INTRAVENOUS | Status: DC
  Filled 2016-05-09: qty 5

## 2016-05-09 MED ORDER — SUGAMMADEX SODIUM 200 MG/2ML IV SOLN
INTRAVENOUS | Status: AC
  Filled 2016-05-09: qty 2

## 2016-05-09 MED ORDER — FLUTICASONE PROPIONATE 50 MCG/ACT NA SUSP
1.0000 | Freq: Every day | NASAL | Status: DC | PRN
  Filled 2016-05-09: qty 16

## 2016-05-09 MED ORDER — CARVEDILOL 25 MG PO TABS
25.0000 mg | ORAL_TABLET | Freq: Two times a day (BID) | ORAL | Status: DC
  Administered 2016-05-09 – 2016-05-10 (×2): 25 mg via ORAL
  Filled 2016-05-09 (×2): qty 1

## 2016-05-09 MED ORDER — BUPIVACAINE-EPINEPHRINE (PF) 0.5% -1:200000 IJ SOLN
INTRAMUSCULAR | Status: DC | PRN
  Administered 2016-05-09: 70 mL

## 2016-05-09 MED ORDER — BUPIVACAINE-EPINEPHRINE (PF) 0.5% -1:200000 IJ SOLN
70.0000 mL | Freq: Once | INTRAMUSCULAR | Status: DC
  Filled 2016-05-09: qty 90

## 2016-05-09 MED ORDER — ONDANSETRON HCL 4 MG/2ML IJ SOLN: 4.0000 mg | INTRAMUSCULAR | Status: DC | PRN

## 2016-05-09 MED ORDER — STERILE WATER FOR IRRIGATION IR SOLN
Status: DC | PRN
  Administered 2016-05-09: 3000 mL

## 2016-05-09 MED ORDER — LIDOCAINE HCL (CARDIAC) 20 MG/ML IV SOLN
INTRAVENOUS | Status: AC
  Filled 2016-05-09: qty 5

## 2016-05-09 MED ORDER — ACETAMINOPHEN 500 MG PO TABS
1000.0000 mg | ORAL_TABLET | Freq: Four times a day (QID) | ORAL | Status: DC | PRN
  Administered 2016-05-09: 1000 mg via ORAL
  Filled 2016-05-09: qty 2

## 2016-05-09 MED ORDER — ROCURONIUM BROMIDE 100 MG/10ML IV SOLN
INTRAVENOUS | Status: DC | PRN
  Administered 2016-05-09: 60 mg via INTRAVENOUS
  Administered 2016-05-09 (×3): 20 mg via INTRAVENOUS

## 2016-05-09 MED ORDER — LACTATED RINGERS IV SOLN
INTRAVENOUS | Status: DC
  Administered 2016-05-09: 07:00:00 via INTRAVENOUS

## 2016-05-09 MED ORDER — KETOROLAC TROMETHAMINE 30 MG/ML IJ SOLN
30.0000 mg | Freq: Once | INTRAMUSCULAR | Status: AC
  Administered 2016-05-09: 30 mg via INTRAVENOUS

## 2016-05-09 MED ORDER — ACETAMINOPHEN 500 MG PO TABS
1000.0000 mg | ORAL_TABLET | Freq: Four times a day (QID) | ORAL | Status: DC
  Administered 2016-05-09 – 2016-05-10 (×3): 1000 mg via ORAL
  Filled 2016-05-09 (×4): qty 2

## 2016-05-09 MED ORDER — MIDAZOLAM HCL 2 MG/2ML IJ SOLN
INTRAMUSCULAR | Status: AC
  Filled 2016-05-09: qty 2

## 2016-05-09 MED ORDER — BUPIVACAINE-EPINEPHRINE (PF) 0.25% -1:200000 IJ SOLN
INTRAMUSCULAR | Status: AC
  Filled 2016-05-09: qty 30

## 2016-05-09 MED ORDER — ONDANSETRON HCL 4 MG/2ML IJ SOLN
INTRAMUSCULAR | Status: AC
  Filled 2016-05-09: qty 2

## 2016-05-09 MED ORDER — ONDANSETRON HCL 4 MG/2ML IJ SOLN
INTRAMUSCULAR | Status: DC | PRN
  Administered 2016-05-09: 4 mg via INTRAVENOUS

## 2016-05-09 MED ORDER — DOCUSATE SODIUM 100 MG PO CAPS
100.0000 mg | ORAL_CAPSULE | Freq: Two times a day (BID) | ORAL | Status: DC
  Administered 2016-05-09 – 2016-05-10 (×2): 100 mg via ORAL
  Filled 2016-05-09 (×2): qty 1

## 2016-05-09 MED ORDER — BACITRACIN ZINC 500 UNIT/GM EX OINT
TOPICAL_OINTMENT | CUTANEOUS | Status: AC
  Filled 2016-05-09: qty 28.35

## 2016-05-09 MED ORDER — ESTRADIOL 0.1 MG/GM VA CREA
TOPICAL_CREAM | VAGINAL | Status: AC
  Filled 2016-05-09: qty 85

## 2016-05-09 MED ORDER — HYDROCODONE-ACETAMINOPHEN 5-325 MG PO TABS: 1.0000 | ORAL_TABLET | ORAL | 0 refills | Status: AC | PRN

## 2016-05-09 MED ORDER — PROPOFOL 10 MG/ML IV BOLUS
INTRAVENOUS | Status: DC | PRN
  Administered 2016-05-09: 150 mg via INTRAVENOUS

## 2016-05-09 MED ORDER — FENTANYL CITRATE (PF) 100 MCG/2ML IJ SOLN
25.0000 ug | INTRAMUSCULAR | Status: DC | PRN
  Administered 2016-05-09 (×2): 50 ug via INTRAVENOUS

## 2016-05-09 MED ORDER — IRBESARTAN 150 MG PO TABS
150.0000 mg | ORAL_TABLET | Freq: Every day | ORAL | Status: DC
  Administered 2016-05-10: 150 mg via ORAL
  Filled 2016-05-09: qty 1

## 2016-05-09 MED ORDER — HYDROMORPHONE HCL 1 MG/ML IJ SOLN: 0.5000 mg | INTRAMUSCULAR | Status: DC | PRN

## 2016-05-09 MED ORDER — DILTIAZEM HCL ER BEADS 240 MG PO CP24
360.0000 mg | ORAL_CAPSULE | Freq: Every day | ORAL | Status: DC
  Administered 2016-05-10: 360 mg via ORAL
  Filled 2016-05-09 (×2): qty 1

## 2016-05-09 MED ORDER — LIDOCAINE HCL (CARDIAC) 20 MG/ML IV SOLN
INTRAVENOUS | Status: DC | PRN
  Administered 2016-05-09: 100 mg via INTRAVENOUS

## 2016-05-09 MED ORDER — LIDOCAINE HCL 2 % EX GEL
CUTANEOUS | Status: AC
  Filled 2016-05-09: qty 5

## 2016-05-09 MED ORDER — SUGAMMADEX SODIUM 200 MG/2ML IV SOLN
INTRAVENOUS | Status: DC | PRN
  Administered 2016-05-09: 160 mg via INTRAVENOUS

## 2016-05-09 MED ORDER — CEFAZOLIN SODIUM-DEXTROSE 2-4 GM/100ML-% IV SOLN: 2.0000 g | INTRAVENOUS | Status: DC

## 2016-05-09 MED ORDER — SODIUM CHLORIDE 0.9 % IR SOLN
Status: DC | PRN
  Administered 2016-05-09: 500 mL

## 2016-05-09 MED ORDER — CEFAZOLIN SODIUM-DEXTROSE 2-4 GM/100ML-% IV SOLN
2.0000 g | INTRAVENOUS | Status: AC
  Administered 2016-05-09: 2 g via INTRAVENOUS
  Filled 2016-05-09: qty 100

## 2016-05-09 MED ORDER — FLUORESCEIN SODIUM 10 % IV SOLN
INTRAVENOUS | Status: AC
  Filled 2016-05-09: qty 5

## 2016-05-09 MED ORDER — PROPOFOL 10 MG/ML IV BOLUS
INTRAVENOUS | Status: AC
  Filled 2016-05-09: qty 20

## 2016-05-09 MED ORDER — INSULIN ASPART 100 UNIT/ML ~~LOC~~ SOLN: 0.0000 [IU] | Freq: Every day | SUBCUTANEOUS | Status: DC

## 2016-05-09 MED ORDER — INSULIN ASPART 100 UNIT/ML ~~LOC~~ SOLN
0.0000 [IU] | Freq: Three times a day (TID) | SUBCUTANEOUS | Status: DC
  Administered 2016-05-09: 2 [IU] via SUBCUTANEOUS
  Administered 2016-05-10: 3 [IU] via SUBCUTANEOUS

## 2016-05-09 MED ORDER — ESTRADIOL 0.1 MG/GM VA CREA
TOPICAL_CREAM | VAGINAL | Status: DC | PRN
  Administered 2016-05-09: 1 via VAGINAL

## 2016-05-09 MED ORDER — FENTANYL CITRATE (PF) 100 MCG/2ML IJ SOLN
INTRAMUSCULAR | Status: DC | PRN
  Administered 2016-05-09 (×5): 50 ug via INTRAVENOUS

## 2016-05-09 MED ORDER — LACTATED RINGERS IV SOLN
INTRAVENOUS | Status: DC
  Administered 2016-05-09 – 2016-05-10 (×2): via INTRAVENOUS

## 2016-05-09 MED ORDER — OXYCODONE HCL 5 MG PO TABS
5.0000 mg | ORAL_TABLET | ORAL | Status: DC | PRN
  Administered 2016-05-09 – 2016-05-10 (×3): 5 mg via ORAL
  Filled 2016-05-09 (×3): qty 1

## 2016-05-09 MED ORDER — FENTANYL CITRATE (PF) 250 MCG/5ML IJ SOLN
INTRAMUSCULAR | Status: AC
  Filled 2016-05-09: qty 5

## 2016-05-09 MED ORDER — CEFAZOLIN SODIUM-DEXTROSE 2-4 GM/100ML-% IV SOLN
INTRAVENOUS | Status: AC
  Filled 2016-05-09: qty 100

## 2016-05-09 MED ORDER — BELLADONNA ALKALOIDS-OPIUM 16.2-60 MG RE SUPP
RECTAL | Status: AC
  Filled 2016-05-09: qty 1

## 2016-05-09 MED ORDER — ROCURONIUM BROMIDE 100 MG/10ML IV SOLN
INTRAVENOUS | Status: AC
Start: 2016-05-09 — End: 2016-05-09
  Filled 2016-05-09: qty 1

## 2016-05-09 MED ORDER — ONDANSETRON HCL 4 MG/2ML IJ SOLN: 4.0000 mg | Freq: Once | INTRAMUSCULAR | Status: DC | PRN

## 2016-05-09 MED ORDER — BELLADONNA ALKALOIDS-OPIUM 16.2-60 MG RE SUPP
RECTAL | Status: DC | PRN
  Administered 2016-05-09: 1 via RECTAL

## 2016-05-09 MED ORDER — ROSUVASTATIN CALCIUM 20 MG PO TABS
20.0000 mg | ORAL_TABLET | Freq: Every evening | ORAL | Status: DC
  Administered 2016-05-09: 20 mg via ORAL
  Filled 2016-05-09: qty 1

## 2016-05-09 MED ORDER — FENTANYL CITRATE (PF) 100 MCG/2ML IJ SOLN
INTRAMUSCULAR | Status: AC
  Filled 2016-05-09: qty 2

## 2016-05-09 MED ORDER — TERAZOSIN HCL 5 MG PO CAPS
5.0000 mg | ORAL_CAPSULE | Freq: Every day | ORAL | Status: DC
  Administered 2016-05-09: 5 mg via ORAL
  Filled 2016-05-09 (×2): qty 1

## 2016-05-09 SURGICAL SUPPLY — 90 items
ADAPTER GOLDBERG URETERAL (ADAPTER) ×4 IMPLANT
ALLOGRAFT TUTOPLAST AXIS 6X12 (Tissue) ×2 IMPLANT
BAG DECANTER FOR FLEXI CONT (MISCELLANEOUS) IMPLANT
BAG URINE DRAINAGE (UROLOGICAL SUPPLIES) ×8 IMPLANT
BAG URO CATCHER STRL LF (MISCELLANEOUS) IMPLANT
BLADE HEX COATED 2.75 (ELECTRODE) IMPLANT
BLADE SURG 15 STRL LF DISP TIS (BLADE) ×2 IMPLANT
BLADE SURG 15 STRL SS (BLADE) ×2
BLADE SURG SZ10 CARB STEEL (BLADE) ×4 IMPLANT
CATH BONANNO SUPRAPUBIC 14G (CATHETERS) IMPLANT
CATH FOLEY 2WAY 5CC 16FR (CATHETERS)
CATH FOLEY 2WAY SLVR  5CC 14FR (CATHETERS) ×2
CATH FOLEY 2WAY SLVR  5CC 16FR (CATHETERS)
CATH FOLEY 2WAY SLVR  5CC 18FR (CATHETERS)
CATH FOLEY 2WAY SLVR 5CC 14FR (CATHETERS) ×2 IMPLANT
CATH FOLEY 2WAY SLVR 5CC 16FR (CATHETERS) IMPLANT
CATH FOLEY 2WAY SLVR 5CC 18FR (CATHETERS) IMPLANT
CATH INTERMIT  6FR 70CM (CATHETERS) ×8 IMPLANT
CATH URTH STD 16FR FL 2W DRN (CATHETERS) IMPLANT
CLOTH BEACON ORANGE TIMEOUT ST (SAFETY) ×4 IMPLANT
COVER MAYO STAND STRL (DRAPES) ×8 IMPLANT
COVER SURGICAL LIGHT HANDLE (MISCELLANEOUS) ×4 IMPLANT
DECANTER SPIKE VIAL GLASS SM (MISCELLANEOUS) IMPLANT
DEVICE CAPIO SLIM SINGLE (INSTRUMENTS) ×4 IMPLANT
DRAIN PENROSE 18X1/2 LTX STRL (DRAIN) IMPLANT
DRAPE C-ARM 42X120 X-RAY (DRAPES) ×4 IMPLANT
DRAPE SHEET LG 3/4 BI-LAMINATE (DRAPES) ×4 IMPLANT
DRAPE UTILITY 15X26 (DRAPE) IMPLANT
ELECT PENCIL ROCKER SW 15FT (MISCELLANEOUS) ×4 IMPLANT
ELECT REM PT RETURN 9FT ADLT (ELECTROSURGICAL) ×4
ELECTRODE REM PT RTRN 9FT ADLT (ELECTROSURGICAL) ×2 IMPLANT
GAUZE SPONGE 4X4 16PLY XRAY LF (GAUZE/BANDAGES/DRESSINGS) ×4 IMPLANT
GLOVE BIO SURGEON STRL SZ 6.5 (GLOVE) ×6 IMPLANT
GLOVE BIO SURGEONS STRL SZ 6.5 (GLOVE) ×2
GLOVE BIOGEL M STRL SZ7.5 (GLOVE) ×12 IMPLANT
GLOVE INDICATOR 6.5 STRL GRN (GLOVE) ×8 IMPLANT
GOWN STRL REUS W/TWL LRG LVL3 (GOWN DISPOSABLE) ×12 IMPLANT
GOWN STRL REUS W/TWL XL LVL3 (GOWN DISPOSABLE) ×12 IMPLANT
GUIDEWIRE STR DUAL SENSOR (WIRE) ×4 IMPLANT
HOLDER FOLEY CATH W/STRAP (MISCELLANEOUS) ×4 IMPLANT
IV NS 1000ML (IV SOLUTION)
IV NS 1000ML BAXH (IV SOLUTION) IMPLANT
KIT BASIN OR (CUSTOM PROCEDURE TRAY) ×4 IMPLANT
LIQUID BAND (GAUZE/BANDAGES/DRESSINGS) IMPLANT
MANIFOLD NEPTUNE II (INSTRUMENTS) ×4 IMPLANT
NEEDLE HYPO 22GX1.5 SAFETY (NEEDLE) ×4 IMPLANT
NEEDLE MAYO 6 CRC TAPER PT (NEEDLE) ×4 IMPLANT
NEEDLE SPNL 22GX3.5 QUINCKE BK (NEEDLE) ×4 IMPLANT
NEEDLE SPNL 22GX7 QUINCKE BK (NEEDLE) ×4 IMPLANT
NS IRRIG 1000ML POUR BTL (IV SOLUTION) IMPLANT
PACK CYSTO (CUSTOM PROCEDURE TRAY) ×4 IMPLANT
PACKING VAGINAL (PACKING) ×4 IMPLANT
PLUG CATH AND CAP STER (CATHETERS) ×4 IMPLANT
RETRACTOR LONRSTAR 16.6X16.6CM (MISCELLANEOUS) ×2 IMPLANT
RETRACTOR STAY HOOK 5MM (MISCELLANEOUS) ×4 IMPLANT
RETRACTOR STER APS 16.6X16.6CM (MISCELLANEOUS) ×4
SCRUB TECHNI CARE 4 OZ NO DYE (MISCELLANEOUS) IMPLANT
SHEET LAVH (DRAPES) ×4 IMPLANT
SLING ALTIS SYSTEM (Sling) ×4 IMPLANT
SPONGE LAP 4X18 X RAY DECT (DISPOSABLE) ×8 IMPLANT
SUCTION FRAZIER HANDLE 12FR (TUBING)
SUCTION TUBE FRAZIER 12FR DISP (TUBING) IMPLANT
SUT CAPIO ETHIBPND (SUTURE) ×12 IMPLANT
SUT VIC AB 0 CT1 18XCR BRD 8 (SUTURE) ×4 IMPLANT
SUT VIC AB 0 CT1 27 (SUTURE) ×2
SUT VIC AB 0 CT1 27XBRD ANTBC (SUTURE) ×2 IMPLANT
SUT VIC AB 0 CT1 8-18 (SUTURE) ×4
SUT VIC AB 0 CT2 27 (SUTURE) IMPLANT
SUT VIC AB 2-0 CT1 27 (SUTURE)
SUT VIC AB 2-0 CT1 TAPERPNT 27 (SUTURE) IMPLANT
SUT VIC AB 2-0 CT2 27 (SUTURE) IMPLANT
SUT VIC AB 2-0 SH 27 (SUTURE) ×2
SUT VIC AB 2-0 SH 27X BRD (SUTURE) ×2 IMPLANT
SUT VIC AB 2-0 UR5 27 (SUTURE) ×12 IMPLANT
SUT VIC AB 2-0 UR6 27 (SUTURE) ×16 IMPLANT
SUT VIC AB 3-0 PS2 18 (SUTURE)
SUT VIC AB 3-0 PS2 18XBRD (SUTURE) IMPLANT
SUT VIC AB 3-0 SH 27 (SUTURE) ×12
SUT VIC AB 3-0 SH 27X BRD (SUTURE) IMPLANT
SUT VIC AB 3-0 SH 27XBRD (SUTURE) ×12 IMPLANT
SUT VICRYL 0 TIES 12 18 (SUTURE) ×4 IMPLANT
SUT VICRYL 0 UR6 27IN ABS (SUTURE) IMPLANT
SYRINGE 10CC LL (SYRINGE) ×4 IMPLANT
TOWEL OR 17X26 10 PK STRL BLUE (TOWEL DISPOSABLE) IMPLANT
TOWEL OR NON WOVEN STRL DISP B (DISPOSABLE) ×4 IMPLANT
TUBING CONNECTING 10 (TUBING) ×6 IMPLANT
TUBING CONNECTING 10' (TUBING) ×2
TUTOPLAST AXIS 6X12 (Tissue) ×4 IMPLANT
WATER STERILE IRR 1500ML POUR (IV SOLUTION) IMPLANT
YANKAUER SUCT BULB TIP 10FT TU (MISCELLANEOUS) ×4 IMPLANT

## 2016-05-09 NOTE — Op Note (Signed)
Operative Note:  Pre-operative diagnosis: Pelvic floor prolapse with urinary incontinence, with anterior vault prolapse, posterior vault prolapse, apical prolapse, urethral hypermobility.  Postoperative diagnosis: Pelvic floor prolapse with urinary incontinence, with anterior vault prolapse, urethral hypermobility.  Operation:  1) Cystourethroscopy 2) Bilateral ureteral stent placement and removal 3) Anterior vault repair with Kelly plication and sacrospinous fixation using Axis dermal graft repair 4) Coloplast Altis midurethral sling placement  Surgeon:  Jethro BolusSigmund Tannenbaum, MD  Resident: Lincoln Brighamroy Sukhu, MD  Implants:  Implant Name Type Inv. Item Serial No. Manufacturer Lot No. LRB No. Used  SLING ALTIS SYSTEM - [id number] Sling SLING ALTIS SYSTEM  COLOPLAST   1  TUTOPLAST AXIS 6X12 - [id number] Tissue TUTOPLAST AXIS 6X12 [id number] COLOPLAST [id number]   1     Drains: 14 F foley catheter   Complications: None  Anesthesia:  General ET tube  Preparation: After appropriate preanesthesia, the patient was brought to the operating room, placed on the operating table in the dorsal supine position. She received 2g of IV Ancef, 1 g of IV Tylenol. Timeout was observed.  Indications: Please see prior H&P for history. History of cystocele, rectocele and combination stress/urge incontinence with low leak point pressure.  Findings: 1) atrophic appearing urethral meatus 2) Cystocele reduced with Kelly plication and dermis graft 3) Successful placement of mid urethral sling 4) No evidence of injury to the urethra, bladder, or ureters  Procedure:   We first performed cystoscopy with bilateral ureteral orifices seen. A 6Fr open ended catheter was inserted into each ureteral orifice with assistance of a sensor wire and advanced into the renal pelvis bilaterally under fluoroscopic guidance. The bladder was then left full and the cystoscope was removed. A 48Fr foley catheter was inserted per  urethra with return of irrigant and the open ended catheters were connected to the foley and the bag using a Layla BarterGoldberg adapter.   We then turned the case over to our gynecologic colleagues who performed a vaginal hysterectomy and posterior repair.  [facility] retractor was then placed, and a 48F foley catheter was placed. Using a blue marking pen, the bladder neck is identified, the mid urethra was also identified and marked. We identified our intended area of incision for the cystocele repair which in the midline was approximately 1 cm proximal to the urethrovaginal junction. This is marked with a semi-lunar horizontal line with a blue pen. 60 cc of lidocaine 0.5% with epinephrine 1-200,000 diluted in normal saline is then injected in the proposed incision site to serve as Hydro dissection to the level of the ischial spines, as well as for hemostasis.  Using a 15 blade, the horizontal semilunar incision was made. We sharply and bluntly dissected the overlying vaginal mucosa from the underlying pubocervical fascia to the white line bilaterally.  This was dissected laterally to the level of the ischial spines, proximally to ~0.5 cm distal to the vaginal cuff and distally to the bladder neck. With the bladder empty we next identified and cleared off the ischial spines bilaterally with finger dissection and also defined the sacrospinous ligaments. There was a moderate amount of scar from prior hysterectomy which we dissected through. Next, we performed a standard Kelly plication, using interrupted 2-0 Vicryl sutures, placed in horizontal mattress fashion with excellent reduction of the cystocele. We did not shorten the anterior vaginal wall or plicate the bladder neck.   Next, using the Cappio device, we placed an 0 Ethibond through the sacrospinous ligaments bilaterally. Sutures were placed one finger breath medial  to the ischial spines bilaterally, and their location was palpated for accuracy. Next we  pre-placed four separate 2-0 vicryl sutures through the pubocervical fascia - one proximally in the midline, one distally in the midline and one on each side of the distal midline suture.   I then cut a 5 x 6 Coloplast Axis dermal graft in the shape of a trapezoid.  The needle was then passed through the graft in each corresponding area and we then utilized a free needle to pass the second end of each suture through the graft as well so it would lie flat when we sutured it in place. We simultaneously tied down the sutures within the sacrospinous ligament which demonstrated excellent support. The remainder of the anchoring sutures were tied down with excellent cosmetic effect and the graft sat in a flat position and was tension free.  We then closed the anterior vaginal wall in an inverted U shape with two running 3-0 Vicryls. She had excellent vaginal length.  We then turned our attention to placement of the Altis sling. The mid-urethra was marked with a blue pen. 5 cc of Marcaine solution was injected bilaterally for hydrodissection. The vaginal incision over the mid urethra is made with a 15 blade. Subcutaneous tissue is dissected, bilaterally. Dissection is carried out towards the pelvic sidewall towards the obturator membrane just inferior to the insertion point of the adductor longus tendon.   The Altis sling is secured to the passing trocar in standard fashion first on the right side and then the second dynamic tensioning side was also passed with the left sided trocar. We examined the sling and noted that the sling sat in an appropriate flat submucosal position. We examined both vaginal fornices and confirmed this had been properly placed.    Next, Irrigation of the vagina was accomplished and we re-filled the bladder and tensioned the sling via the dynamic tensioning sling while testing valsalva. We tensioned it just enough to prevent stress urinary incontinence with moderate valsalva with a  moderately full bladder. We assessed the sling and it was smooth against the urethra, but not tight. We were able to pass the end of a right angle between the urethral and sling without resistance.   I then cystoscoped the patient. There was no distortion of the anatomy or injury to the bladder or urethra. There was irritation of the ureteral orifices from the stents.. Following cystoscopy, Foley catheter was replaced.    Minimal bleeding was noted.  We then closed the midline vertical vaginal incision making sure not to incorporate the sling with a single running 2-0 Vicryl suture.  Vaginal packing was placed using estrogen and a vaginal packing, and Foley was left to straight drainage. The patient received IV Tylenol, was awakened, and taken to recovery room in good condition.  Attestation: Dr. Patsi Searsannenbaum was present and scrubbed for the entirety of the procedure.

## 2016-05-09 NOTE — Progress Notes (Signed)
H and P on the chart. No significant changes. Labs reviewed Will proceed with  TVH  Possible Posterior Repair Possible Perineoplasty  Dr. Patsi Searsannenbaum to start procedure with stent placement and repair of cystocele

## 2016-05-09 NOTE — H&P (Signed)
History: [name] is a 70 year old female, type II diabetic, with a history of cystocele, and urinary incontinence. She is para 8-8-0. She is allergic to metformin and Actos, and currently treated with Victoza.   She complains of several years of worsening urge and urge incontinence, but denies stress urinary incontinence. She has worsening pelvic organ prolapse, for greater than one year, with externalization of her bladder-1 inch beyond the introitus. She notes that her bladder goes back into normal position when she lies down, but when she gets up, the bladder falls out. She wears 1 pad per day because of urge incontinence. There is no hysterectomy by history. She has a 10-pack-year history of tobacco use but none 40 years.  She denies constipation; rather, the patient complains of an encopresis, with some bowel movement leaking out into her underwear, causing her to need to change underwear. She did have normal colonoscopy 5 years ago. GYN examination has noted pelvic organ prolapse with both cystocele and rectocele as well as prolapsed cervix. CT scan with contrast in April 2017 showed benign renal cysts with normal ureters. Physical examination showed midline anterior vault defect consistent with a grade 3 cystocele, protruding outside the introitus by 2 cm in the standing position. No enterocele was identified rectocele was present, grade 2. The cervix was identified [name] test was positive Q-tip test was positive.  Video urodynamics showed early sensation at 55 mL with unstable bladder, occurring at one and 7 mL with severe urinary leakage. Valsalva leak point pressure was 40 cm of water pressure, with no leakage noted prior to reduction of the cystocele pressure flow study showed a maximum flow of 8 mL/s with detrusor pressure at maximum flow of 77 m a wire pressure. PVR is 20 mL. VCUG was negative for reflux. No bladder stones were seen. The patient has a bladder capacity of 125 mL with some  instability and involuntary voiding.  Stress urinary incontinence is present with significant anterior vault prolapse when the prolapse is reduced.  The patient was felt to need vaginal hysterectomy, for prolapsed uterus, anterior vault repair with augmented apical suspension using Coloplast axis dermis, and sacrospinous fixation, as well as Altis single incision sling-all in combination with vaginal hysterectomy per Dr. Vincente PoliGrewal. She will need post operative physical therapy. She has had preoperative evaluation for bowel problems with general surgery, who was recommended Citrucel 4 pills per day.     Allergies: Actos metformin    Medications: 1. Aspirin 81 mg per day                       2.  Carvadiol 25 mg by mouth                       3. Crestor 20 mg per day                      4. Diltiazem ER 360 mg per day                       5.  Olmesartan 40mg  po/day                       6 terazosin 5 mg by mouth per day                       7. Vicytoza 18/3 L subcutaneous.   None-GU past  medical history: 1. Removal of breast lesion-12/15/2015                                                      2.  Tubal ligation-12/15/2015  GU past medical history: Female genital prolapse  Urinary frequency  Gross hematuria  Urinary tract infection April 2017  Cystocele  Rectocele  Stress urinary incontinence  Urge incontinence  Urgency of urination: Marital status: Widowed   Tobacco: None since June 1971 patient smoked for 10 years. One pack per day.   However whole: None  Caffeine: 2 caffeinated drinks per day   Transfusions: None   Occupation: Retired Education officer, environmentalmill worker  Review of systems: Upper colon denies nausea, vomiting, indigestion, heartburn.  Lower: Denies diarrhea or constipation except as noted in history of present illness.  Constitutional: No fever, night sweats, weight loss, or fatigue. Skin: No rash, lesions, or itching.  Eyes: No blurred vision or double  vision.  Ears nose throat: Negative.  Hematologic/lymphatic: No swollen glands or easy bruising.  Cardiovascular: No leg swelling or chest pain.  Respiratory: No cough or shortness of breath.  Endocrine: Denies excessive thirst:  Musculoskeletal: Denies back pain or joint pain.  Neurologic: Denies headache or dizziness. Para psychologic: Denies anxiety or depression.  Physical examination: Well-developed and nourished African-American female in no acute distress.  HEENT: PERRLA lymphoma that: Supple nontender no nodes chest: Clear to P&A  Abdomen: Obese plus bowel sounds without organomegaly without masses  Genitalia: Normal external female genitalia. Internal examination: See history of present illness. Extreme ease: Without edema.  Vascular: Pulses palpable. Neurologic: Physiologic. Past impression: Pelvic floor prolapse is noted in history of present illness. Para and plan patient for surgical intervention for pelvic floor prolapse.

## 2016-05-09 NOTE — Op Note (Signed)
NAMGerri Lins:  [name], [name]                 ACCOUNT NO.:  [id number]  MEDICAL RECORD NO.:  [id number]  LOCATION:  1420                         [facility]  PHYSICIAN:  Lajean Boese L. Abdirahim Flavell, M.D.DATE OF BIRTH:  [birth year]  DATE OF PROCEDURE:  05/09/2016 DATE OF DISCHARGE:                              OPERATIVE REPORT   PREOPERATIVE DIAGNOSES:  Pelvic relaxation symptomatic and uterine prolapse, cystocele and rectocele.  POSTOPERATIVE DIAGNOSES:  Pelvic relaxation symptomatic and uterine prolapse, cystocele and rectocele.  PROCEDURE:  Total vaginal hysterectomy, McCall culdoplasty, posterior repair and perineoplasty.  SURGEONS:  Corra Kaine L. Vincente PoliGrewal, M.D.  ASSISTANT:  Dr. Belva AgeeElise Leger.  ESTIMATED BLOOD LOSS:  Less than 100 mL.  COMPLICATIONS:  None.  DRAINS:  Foley catheter.  PATHOLOGY:  Uterus and cervix.  DESCRIPTION OF PROCEDURE:  The patient was taken to the operating room after consent was obtained in regard to all the risks associated with the procedure.  She was then intubated.  She was then prepped and draped.  A Foley catheter had been inserted.  Dr. Patsi Searsannenbaum scrubbed in initially with his resident and cystoscopy and stent placement was performed, that will be dictated by him.  At the completion of that they scrubbed out and myself and Dr. Elon SpannerLeger scrubbed in.  Time-out was performed.  When we scrubbed in and the patient was already draped, a Foley catheter was in and the stents were in place.  Exam under anesthesia revealed a grade 3 uterine prolapse with an elongated cervix. Grade 3 cystocele and grade 1-2 rectocele with perineal relaxation.  The speculum was placed in the vagina.  The cervix was grasped with a tenaculum and a circumferential incision was made around the cervix.  We then used blunt and sharp dissection to dissect the overlying vaginal epithelium away from the cervix on all sides.  The posterior cul-de-sac was entered sharply using Metzenbaum scissors  and we then entered the cul-de-sac using weighted speculum.  I then used curved Heaney clamps, to clamp the uterosacral cardinal complexes on either side.  Each pedicle was clamped, cut, and suture ligated using 0 Vicryl suture.  We then were able to enter the anterior cul-de-sac sharply using Metzenbaum scissors.  The remainder of the broad ligament was clamped on either side using curved Heaney clamps, staying snug beside the cervix and uterus, each pedicle was clamped, cut, and suture ligated using 0 Vicryl suture.  The uterus was retroflexed.  The remainder of the broad ligament was clamped on either sides.  Each of the specimen was removed using Mayo scissors and the pedicles were secured using the suture ligature of 0 Vicryl suture and a free tie of 0 Vicryl suture.  The tubes and ovaries were high in the pelvis and noted to be unremarkable. We then placed a McCall cul-de-sac stitch by using 0 Vicryl suture in the standard fashion and then closed the posterior cuff in a running, locked stitch using 0 Vicryl suture making sure that the peritoneum and the vaginal epithelium were closed together for hemostasis.  The cuff was then closed completely using 0 Vicryl running lock stitch.  At this point, we then proceeded with the posterior repair, placing Allis  clamps at the 5 and 7 o'clock position making a V-shaped incision with scalpel and making a midline incision about two-thirds of the way the vagina. Using Metzenbaum scissors, we then dissected the rectovaginal fascia away from the overlying vaginal epithelium using sharp and blunt dissection.  There was a small rectocele, it was mostly redundant skin that we noted, however, and then the rectocele was reapproximated using 0 Vicryl and in the midline in standard fashion.  We then trimmed the redundant tissue and then closed the vaginal epithelium using 2-0 Vicryl and running lock stitch.  One stitch of 0 Vicryl was used to  reinforce the perineum.  The remainder of the perineum was then closed using 2-0 Vicryl.  Hemostasis was very good.  All sponge, lap, and instrument counts correct x2.  At this point, Dr. Elon SpannerLeger and myself scrubbed out of the procedure.  Dr. Patsi Searsannenbaum and his assistant were then scrubbed in and performed the remainder of the procedure which will be dictated by him.     Joleah Kosak L. Vincente PoliGrewal, M.D.     Florestine AversMLG/MEDQ  D:  05/09/2016  T:  05/09/2016  Job:  [id number]

## 2016-05-09 NOTE — Anesthesia Procedure Notes (Signed)
Procedure Name: Intubation Date/Time: 05/09/2016 7:51 AM Performed by: Jarvis NewcomerARMISTEAD, Cheyenne Schumm A Pre-anesthesia Checklist: Patient identified, Timeout performed, Emergency Drugs available, Suction available and Patient being monitored Patient Re-evaluated:Patient Re-evaluated prior to inductionOxygen Delivery Method: Circle system utilized Preoxygenation: Pre-oxygenation with 100% oxygen Intubation Type: IV induction Ventilation: Mask ventilation without difficulty Laryngoscope Size: Mac and 4 Grade View: Grade I Tube type: Oral Number of attempts: 1 Airway Equipment and Method: Stylet Placement Confirmation: ETT inserted through vocal cords under direct vision,  positive ETCO2 and breath sounds checked- equal and bilateral Secured at: 22 cm Tube secured with: Tape Dental Injury: Teeth and Oropharynx as per pre-operative assessment

## 2016-05-09 NOTE — Progress Notes (Signed)
Day of Surgery Subjective: The patient is doing well.  No nausea or vomiting. Pain is adequately controlled.  Objective: Vital signs in last 24 hours: Temp:  [98 F (36.7 C)-99.1 F (37.3 C)] 99.1 F (37.3 C) (08/21 1506) Pulse Rate:  [55-58] 56 (08/21 1506) Resp:  [13-16] 15 (08/21 1500) BP: (116-168)/(63-81) 135/71 (08/21 1506) SpO2:  [99 %-100 %] 99 % (08/21 1506) Weight:  [78.9 kg (174 lb)] 78.9 kg (174 lb) (08/21 0629)  Intake/Output from previous day: No intake/output data recorded. Intake/Output this shift: Total I/O In: 2300 [I.V.:2300] Out: 525 [Urine:350; Blood:175]  Physical Exam:  General: Alert and oriented. GI: Soft, Nondistended. Urine: Blood tinged Extremities: Nontender, no erythema, no edema.  Lab Results: No results for input(s): HGB, HCT in the last 72 hours.       No results for input(s): CREATININE in the last 168 hours.         Results for orders placed or performed during the hospital encounter of 05/09/16 (from the past 24 hour(s))  Glucose, capillary     Status: Abnormal   Collection Time: 05/09/16  6:00 AM  Result Value Ref Range   Glucose-Capillary 175 (H) 65 - 99 mg/dL  Glucose, capillary     Status: Abnormal   Collection Time: 05/09/16  1:38 PM  Result Value Ref Range   Glucose-Capillary 132 (H) 65 - 99 mg/dL    Assessment/Plan: POD# 0 s/p anterior repair.  1) Ambulate, Incentive spirometry 2) Advance diet as tolerated   LOS: 0 days   Carolin Coyroy A Sukhu 05/09/2016, 3:25 PM

## 2016-05-09 NOTE — Interval H&P Note (Signed)
History and Physical Interval Note:  05/09/2016 7:59 AM  [name]  has presented today for surgery, with the diagnosis of PELVIC ORGAN PROLAPSE  The various methods of treatment have been discussed with the patient and family. After consideration of risks, benefits and other options for treatment, the patient has consented to  Procedure(s): CYSTOSCOPY WITH RETROGRADE PYELOGRAM/URETERAL STENT PLACEMENT (Bilateral) AUGMENTED ANTERIOR VAGINAL VAULT REPAIR WITH KELLY PLICATION, ALTIS COLOPLAST TISSUE, SACROSPINUS APEX SUSPENSION,   (N/A) ALTIS URETHRAL SLING (N/A) HYSTERECTOMY VAGINAL (N/A) as a surgical intervention .  The patient's history has been reviewed, patient examined, no change in status, stable for surgery.  I have reviewed the patient's chart and labs.  Questions were answered to the patient's satisfaction.     Sae Handrich I Shyteria Lewis

## 2016-05-09 NOTE — Discharge Instructions (Signed)
It is normal to have some vaginal bleeding and a small amount of blood in your urine following this surgery. This will decrease over time.  Activity:  You are encouraged to ambulate frequently (about every hour during waking hours) to help prevent blood clots from forming in your legs or lungs.  However, you should not engage in any heavy lifting (> 10-15 lbs), strenuous activity, or straining.  Diet: You should advance your diet as instructed by your physician.  It will be normal to have some bloating, nausea, and abdominal discomfort intermittently.  Prescriptions:  You will be provided a prescription for pain medication to take as needed.  If your pain is not severe enough to require the prescription pain medication, you may take extra strength Tylenol instead which will have less side effects.  You should also take a prescribed stool softener to avoid straining with bowel movements as the prescription pain medication may constipate you.   Incisions:  You may start showering (but not soaking or bathing in water) the 2nd day after surgery and the incisions simply need to be patted dry after the shower.  No additional care is needed.  What to call us about: You should call the office [phone]) if you develop fever > 101 or develop persistent vomiting.

## 2016-05-09 NOTE — Transfer of Care (Signed)
Immediate Anesthesia Transfer of Care Note  Patient: [name]  Procedure(s) Performed: Procedure(s): CYSTOSCOPY WITH RETROGRADE PYELOGRAM/URETERAL STENT PLACEMENT (Bilateral) AUGMENTED ANTERIOR VAGINAL VAULT REPAIR WITH KELLY PLICATION, AXIS COLOPLAST TISSUE AUGMENTATION, SACROSPINUS APEX SUSPENSION  (N/A) COLOPLAST ALTIS MIDURETHRAL SLING (N/A) HYSTERECTOMY VAGINAL (N/A) PERINEOPLASTY (N/A) POSTERIOR REPAIR (RECTOCELE) (N/A)  Patient Location: PACU  Anesthesia Type:General  Level of Consciousness: awake, alert , oriented and patient cooperative  Airway & Oxygen Therapy: Patient Spontanous Breathing and Patient connected to face mask oxygen  Post-op Assessment: Report given to RN, Post -op Vital signs reviewed and stable and Patient moving all extremities  Post vital signs: Reviewed and stable  Last Vitals:  Vitals:   05/09/16 0604  BP: (!) 168/81  Pulse: (!) 58  Resp: 16  Temp: 36.8 C    Last Pain:  Vitals:   05/09/16 0604  TempSrc: Oral      Patients Stated Pain Goal: 4 (05/09/16 0629)  Complications: No apparent anesthesia complications

## 2016-05-09 NOTE — Brief Op Note (Signed)
05/09/2016  9:40 AM  PATIENT:  [name]  70 y.o. female  PRE-OPERATIVE DIAGNOSIS:  PELVIC ORGAN PROLAPSE  POST-OPERATIVE DIAGNOSIS:  PELVIC ORGAN PROLAPSE  PROCEDURE:  Procedure(s): CYSTOSCOPY WITH RETROGRADE PYELOGRAM/URETERAL STENT PLACEMENT (Bilateral) AUGMENTED ANTERIOR VAGINAL VAULT REPAIR WITH KELLY PLICATION, ALTIS COLOPLAST TISSUE, SACROSPINUS APEX SUSPENSION,   (N/A) ALTIS URETHRAL SLING (N/A) HYSTERECTOMY VAGINAL (N/A) PERINEOPLASTY (N/A) POSTERIOR REPAIR (RECTOCELE) (N/A)  McCall Cul De Sac Culdoplasty SURGEON:  Surgeon(s) and Role: Panel 1:    * Jethro BolusSigmund Tannenbaum, MD - Primary  Panel 2:    * Marcelle OverlieMichelle Kamiryn Bezanson, MD - Primary  PHYSICIAN ASSISTANT:   ASSISTANTS: Dr. Belva AgeeElise Leger   ANESTHESIA:   general  EBL:  Total I/O In: 1000 [I.V.:1000] Out: 75 [Blood:75]  BLOOD ADMINISTERED:none  DRAINS: Urinary Catheter (Foley)   LOCAL MEDICATIONS USED:  NONE  SPECIMEN:  Source of Specimen:  uterus and cervix  DISPOSITION OF SPECIMEN:  PATHOLOGY  COUNTS:  YES  TOURNIQUET:  * No tourniquets in log *  DICTATION: .Other Dictation: Dictation Number dictated  PLAN OF CARE: Admit for overnight observation  PATIENT DISPOSITION:  PACU - hemodynamically stable.   Delay start of Pharmacological VTE agent (>24hrs) due to surgical blood loss or risk of bleeding: not applicable

## 2016-05-10 ENCOUNTER — Encounter (HOSPITAL_COMMUNITY): Payer: Self-pay | Admitting: Urology

## 2016-05-10 LAB — BASIC METABOLIC PANEL
Anion gap: 3 — ABNORMAL LOW (ref 5–15)
BUN: 13 mg/dL (ref 6–20)
CHLORIDE: 105 mmol/L (ref 101–111)
CO2: 29 mmol/L (ref 22–32)
CREATININE: 0.94 mg/dL (ref 0.44–1.00)
Calcium: 8.9 mg/dL (ref 8.9–10.3)
GFR calc Af Amer: >60 mL/min (ref >60)
GFR calc non Af Amer: 60 mL/min — ABNORMAL LOW (ref >60)
GLUCOSE: 159 mg/dL — AB (ref 65–99)
Potassium: 3.5 mmol/L (ref 3.5–5.1)
SODIUM: 137 mmol/L (ref 135–145)

## 2016-05-10 LAB — GLUCOSE, CAPILLARY
GLUCOSE-CAPILLARY: 112 mg/dL — AB (ref 65–99)
Glucose-Capillary: 173 mg/dL — ABNORMAL HIGH (ref 65–99)

## 2016-05-10 LAB — HEMOGLOBIN AND HEMATOCRIT, BLOOD
HCT: 33.8 % — ABNORMAL LOW (ref 36.0–46.0)
Hemoglobin: 11.4 g/dL — ABNORMAL LOW (ref 12.0–15.0)

## 2016-05-10 NOTE — Discharge Summary (Signed)
Date of admission: 05/09/2016  Date of discharge: 05/10/2016  Admission diagnosis: pelvic organ prolapse   Discharge diagnosis: pelvic organ prolapse  Secondary diagnoses: none  History and Physical: For full details, please see admission history and physical. Briefly, [name] is a 70 y.o. year old patient with pelvic organ prolapse.   Hospital Course: 70 yo female who is s/p cytocele repair with Kelly plication and dermis graft, apical sacrospinous suspension, and mid urethral mesh sling placement with Dr. Patsi Searsannenbaum and vaginal hysterctomy with rectocele repair by Dr. Vincente PoliGrewal on 05/09/16. She did well post-operatively. Her diet was slowly advanced and at the time of discharge she was tolerating a regular diet, ambulating at her baseline, was voiding spontaneously after foley catheter removal, vaginal packing had been removed and pain was well controlled with oral narcotics. She was discharged to home on POD#1.  Laboratory values:   Recent Labs  05/09/16 1544 05/10/16 0600  HGB 13.7 11.4*  HCT 41.4 33.8*    Recent Labs  05/09/16 1544 05/10/16 0600  CREATININE 0.93 0.94    Disposition: Home  Discharge instruction: The patient was instructed to be ambulatory but told to refrain from heavy lifting, strenuous activity, or driving.   Discharge medications:    Medication List    STOP taking these medications   aspirin 81 MG tablet     TAKE these medications   carvedilol 12.5 MG tablet Commonly known as:  COREG TAKE 2 TABLETS BY MOUTH TWICE DAILY   carvedilol 12.5 MG tablet Commonly known as:  COREG TAKE 2 TABLETS BY MOUTH TWICE DAILY   CRESTOR 20 MG tablet Generic drug:  rosuvastatin Take 20 mg by mouth every evening.   diltiazem 360 MG 24 hr capsule Commonly known as:  TIAZAC Take 360 mg by mouth daily.   fluticasone 50 MCG/ACT nasal spray Commonly known as:  FLONASE Place 1 spray into both nostrils daily as needed for allergies or rhinitis.    HYDROcodone-acetaminophen 5-325 MG tablet Commonly known as:  NORCO/VICODIN Take 1-2 tablets by mouth every 4 (four) hours as needed for moderate pain. What changed:  how much to take  when to take this  reasons to take this   Liraglutide 18 MG/3ML Soln injection Commonly known as:  VICTOZA INJECT 0.3 ML (1.8 MG TOTAL) INTO SKIN DAILY   VICTOZA 18 MG/3ML Sopn Generic drug:  Liraglutide INJECT 0.3 ML (1.8 MG TOTAL) INTO SKIN DAILY   MICROLET LANCETS Misc Inject 1 Act into the skin 2 (two) times daily.   naproxen sodium 220 MG tablet Commonly known as:  ANAPROX Take 220 mg by mouth 2 (two) times daily as needed (pain).   NEOMYCIN-POLYMYXIN-HYDROCORTISONE 1 % Soln otic solution Commonly known as:  CORTISPORIN Apply 1-2 drops to the toe after soaking BID   olmesartan 40 MG tablet Commonly known as:  BENICAR Take 40 mg by mouth daily.   terazosin 5 MG capsule Commonly known as:  HYTRIN Take 5 mg by mouth at bedtime.       Followup:

## 2016-05-10 NOTE — Care Management Note (Signed)
Case Management Note  Patient Details  Name: [name] MRN: [id number] Date of Birth: 01/25/1946  Subjective/Objective:  70 y/o f admitted w/Prolapse pelvic organs,s/p cystocele. From home.                 Action/Plan:d/c home no needs or orders.  Expected Discharge Date:                  Expected Discharge Plan:  Home/Self Care  In-House Referral:     Discharge planning Services  CM Consult  Post Acute Care Choice:    Choice offered to:     DME Arranged:    DME Agency:     HH Arranged:    HH Agency:     Status of Service:  Completed, signed off  If discussed at [employer] Length of Stay Meetings, dates discussed:    Additional Comments:  Lanier ClamMahabir, Dominick Morella, RN 05/10/2016, 11:49 AM

## 2016-05-10 NOTE — Progress Notes (Signed)
Patient is doing GREAT!!. Pain is a 1.  Slept well.  BP (!) 120/58 (BP Location: Right Arm)   Pulse 76   Temp 99 F (37.2 C) (Oral)   Resp 16   Ht 5\' 2"  (1.575 m)   Wt 78.9 kg (174 lb)   SpO2 92%   BMI 31.83 kg/m  Results for orders placed or performed during the hospital encounter of 05/09/16 (from the past 24 hour(s))  Glucose, capillary     Status: Abnormal   Collection Time: 05/09/16  1:38 PM  Result Value Ref Range   Glucose-Capillary 132 (H) 65 - 99 mg/dL  Hemoglobin and hematocrit, blood     Status: None   Collection Time: 05/09/16  3:44 PM  Result Value Ref Range   Hemoglobin 13.7 12.0 - 15.0 g/dL   HCT 46.941.4 62.936.0 - 52.846.0 %  Basic metabolic panel     Status: Abnormal   Collection Time: 05/09/16  3:44 PM  Result Value Ref Range   Sodium 142 135 - 145 mmol/L   Potassium 3.7 3.5 - 5.1 mmol/L   Chloride 104 101 - 111 mmol/L   CO2 30 22 - 32 mmol/L   Glucose, Bld 158 (H) 65 - 99 mg/dL   BUN 12 6 - 20 mg/dL   Creatinine, Ser 4.130.93 0.44 - 1.00 mg/dL   Calcium 9.6 8.9 - 24.410.3 mg/dL   GFR calc non Af Amer >60 >60 mL/min   GFR calc Af Amer >60 >60 mL/min   Anion gap 8 5 - 15  Glucose, capillary     Status: Abnormal   Collection Time: 05/09/16  4:58 PM  Result Value Ref Range   Glucose-Capillary 135 (H) 65 - 99 mg/dL  Glucose, capillary     Status: Abnormal   Collection Time: 05/09/16  9:40 PM  Result Value Ref Range   Glucose-Capillary 121 (H) 65 - 99 mg/dL   Comment 1 Notify RN   Hemoglobin and hematocrit, blood     Status: Abnormal   Collection Time: 05/10/16  6:00 AM  Result Value Ref Range   Hemoglobin 11.4 (L) 12.0 - 15.0 g/dL   HCT 01.033.8 (L) 27.236.0 - 53.646.0 %  Basic metabolic panel     Status: Abnormal   Collection Time: 05/10/16  6:00 AM  Result Value Ref Range   Sodium 137 135 - 145 mmol/L   Potassium 3.5 3.5 - 5.1 mmol/L   Chloride 105 101 - 111 mmol/L   CO2 29 22 - 32 mmol/L   Glucose, Bld 159 (H) 65 - 99 mg/dL   BUN 13 6 - 20 mg/dL   Creatinine, Ser 6.440.94  0.44 - 1.00 mg/dL   Calcium 8.9 8.9 - 03.410.3 mg/dL   GFR calc non Af Amer 60 (L) >60 mL/min   GFR calc Af Amer >60 >60 mL/min   Anion gap 3 (L) 5 - 15  Glucose, capillary     Status: Abnormal   Collection Time: 05/10/16  7:35 AM  Result Value Ref Range   Glucose-Capillary 173 (H) 65 - 99 mg/dL   Abdomen is soft and non tender  VAGINAL PACKING REMOVED AND IS DRY Impression:  POD #1  Doing well Routine care Discharge home later today ok with me  Follow up with me in 2 weeks

## 2016-05-13 NOTE — Anesthesia Postprocedure Evaluation (Signed)
Anesthesia Post Note  Patient: Gerri LinsVera Ocain  Procedure(s) Performed: Procedure(s) (LRB): CYSTOSCOPY WITH RETROGRADE PYELOGRAM/URETERAL STENT PLACEMENT (Bilateral) AUGMENTED ANTERIOR VAGINAL VAULT REPAIR WITH KELLY PLICATION, AXIS COLOPLAST TISSUE AUGMENTATION, SACROSPINUS APEX SUSPENSION  (N/A) COLOPLAST ALTIS MIDURETHRAL SLING (N/A) HYSTERECTOMY VAGINAL (N/A) PERINEOPLASTY (N/A) POSTERIOR REPAIR (RECTOCELE) (N/A)  Vital Signs Assessment: post-procedure vital signs reviewed and stable Anesthetic complications: no    Last Vitals:  Vitals:   05/09/16 2127 05/10/16 0443  BP: (!) 145/68 (!) 120/58  Pulse: 65 76  Resp: 16 16  Temp: 36.9 C 37.2 C    Last Pain:  Vitals:   05/10/16 1658  TempSrc:   PainSc: 0-No pain                 Abby Stines JENNETTE

## 2017-12-11 ENCOUNTER — Other Ambulatory Visit: Payer: Self-pay | Admitting: Family Medicine

## 2017-12-11 NOTE — Telephone Encounter (Signed)
Please call pt to make aware I am not at this office. Just courtesy call. [facility] then decline refill and have request from Dr. Everlene OtherBouska. Thanks

## 2018-11-05 ENCOUNTER — Other Ambulatory Visit: Payer: Self-pay | Admitting: Family Medicine
